# Patient Record
Sex: Male | Born: 1975 | Race: White | Hispanic: No | Marital: Married | Smoking: Never smoker
Health system: Southern US, Community
[De-identification: ages and names within clinical notes are randomized; demographics above are authoritative.]

## PROBLEM LIST (undated history)

## (undated) DIAGNOSIS — G251 Drug-induced tremor: Secondary | ICD-10-CM

## (undated) DIAGNOSIS — Z87442 Personal history of urinary calculi: Secondary | ICD-10-CM

## (undated) HISTORY — PX: PACEMAKER LEAD REMOVAL: SHX5064

## (undated) HISTORY — PX: OTHER SURGICAL HISTORY: SHX169

---

## 2008-05-31 ENCOUNTER — Emergency Department: Payer: Self-pay | Admitting: Emergency Medicine

## 2009-05-15 ENCOUNTER — Emergency Department: Payer: Self-pay | Admitting: Emergency Medicine

## 2009-07-20 ENCOUNTER — Ambulatory Visit: Payer: Self-pay | Admitting: Internal Medicine

## 2010-01-08 ENCOUNTER — Ambulatory Visit: Payer: Self-pay | Admitting: Cardiology

## 2010-01-10 ENCOUNTER — Ambulatory Visit: Payer: Self-pay | Admitting: Cardiology

## 2010-01-18 ENCOUNTER — Ambulatory Visit: Payer: Self-pay | Admitting: Cardiovascular Disease

## 2010-09-06 ENCOUNTER — Ambulatory Visit: Payer: Self-pay | Admitting: Internal Medicine

## 2010-09-07 ENCOUNTER — Ambulatory Visit: Payer: Self-pay | Admitting: Internal Medicine

## 2011-01-27 IMAGING — CT CT HEAD WITHOUT CONTRAST
1 series · 15 of 29 positions shown, 19 images · non-contrast
Comparison: none

REASON FOR EXAM: TIA  PACEMAKER  NOT ABLE TO HAVE MRI
COMMENTS:

PROCEDURE:     CT  - CT HEAD WITHOUT CONTRAST  - July 20, 2009  [DATE]
RESULT:
TECHNIQUE: Noncontrast CT of the brain is compared to previous images from
05/15/2009 and 05/31/2008.

[Series 2: soft tissue · axial · 0.41mm/px · z∈[+484,+614]mm · 15 of 29 slices shown, 19 images]
[im 2/29  brain]
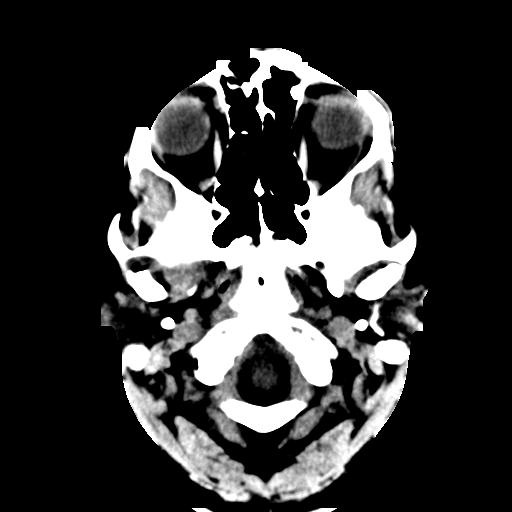
[im 2/29  bone]
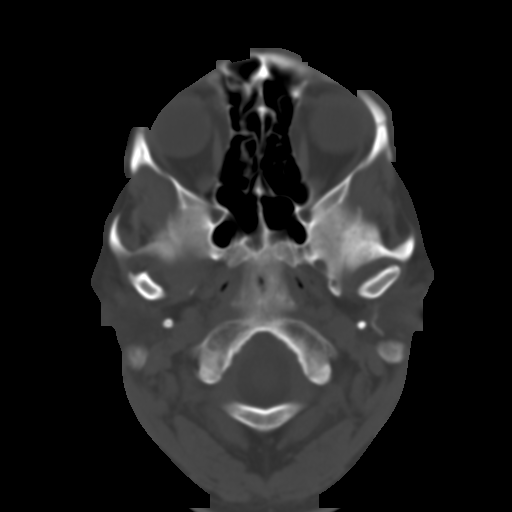
[im 4/29  brain]
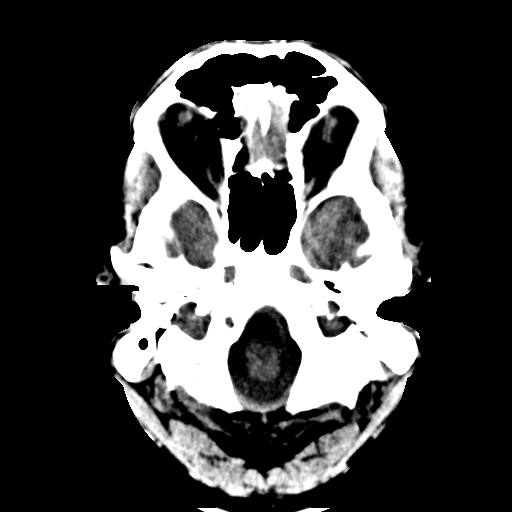
[im 6/29  brain]
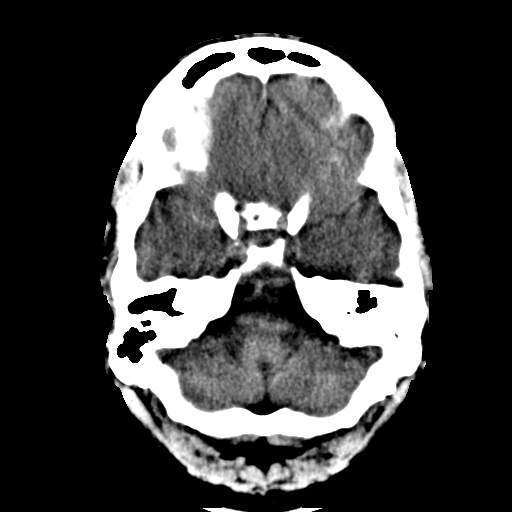
[im 8/29  brain]
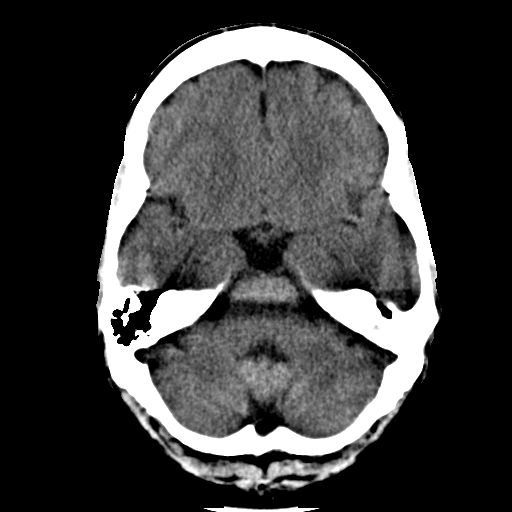
[im 10/29  brain]
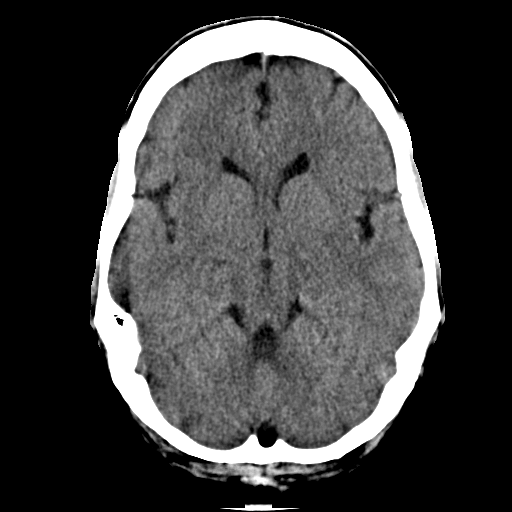
[im 10/29  bone]
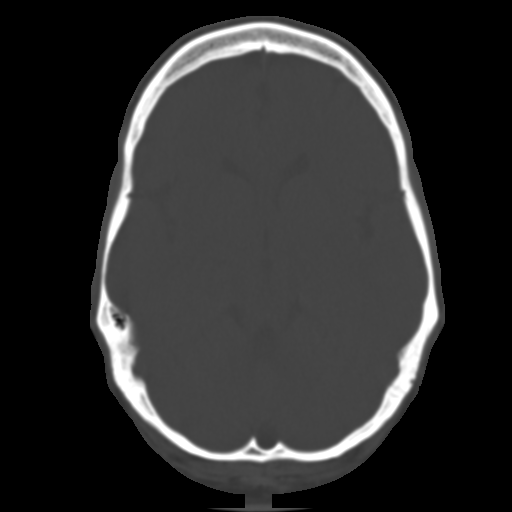
[im 11/29  brain]
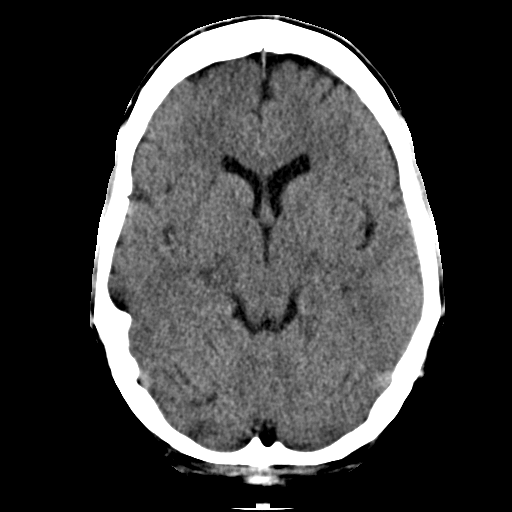
[im 13/29  brain]
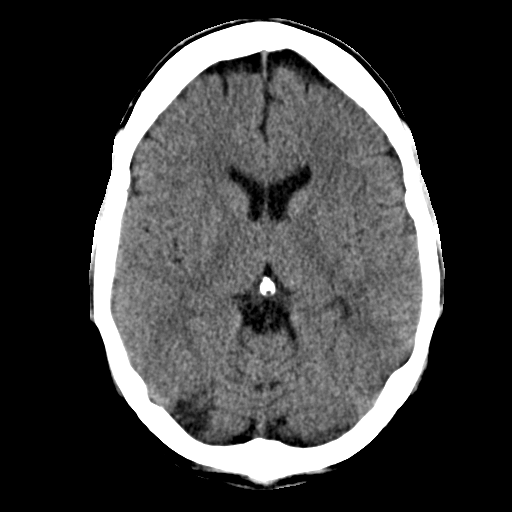
[im 15/29  brain]
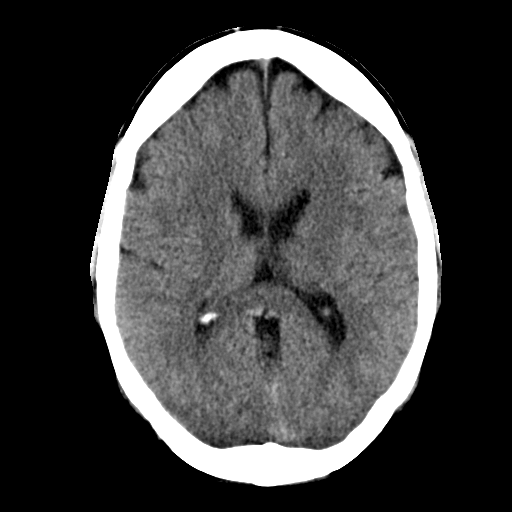
[im 17/29  brain]
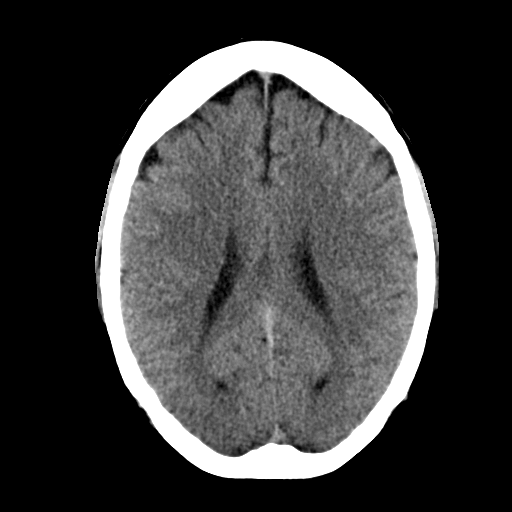
[im 17/29  bone]
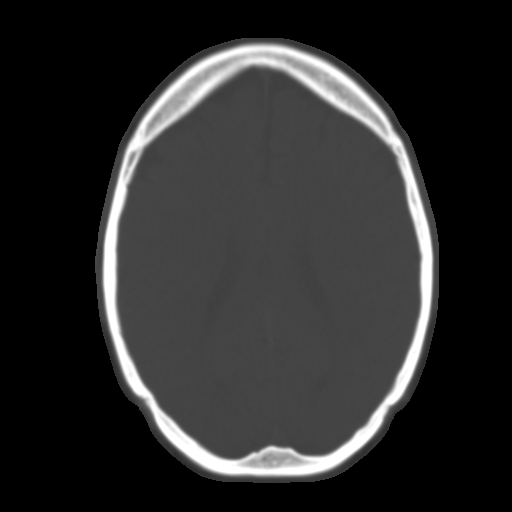
[im 19/29  brain]
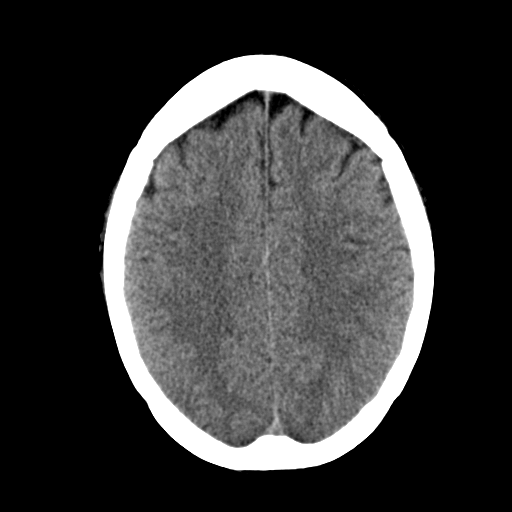
[im 20/29  brain]
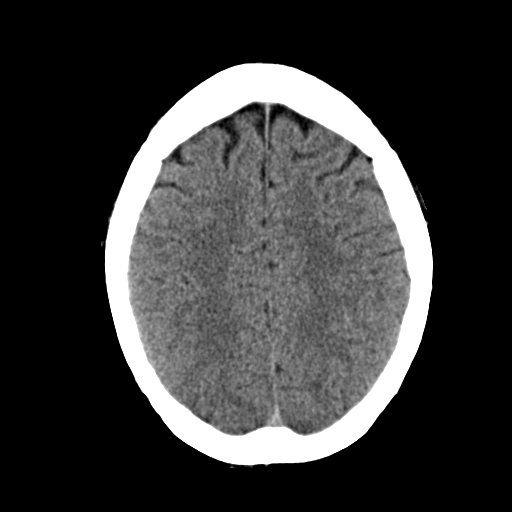
[im 22/29  brain]
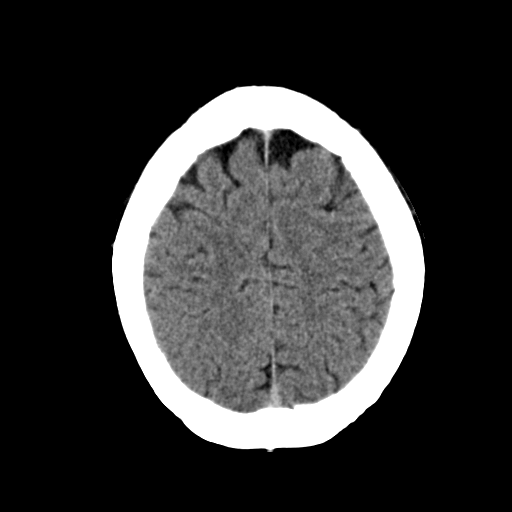
[im 24/29  brain]
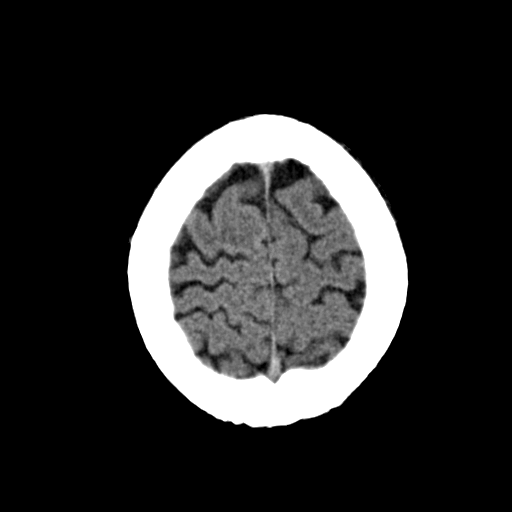
[im 24/29  bone]
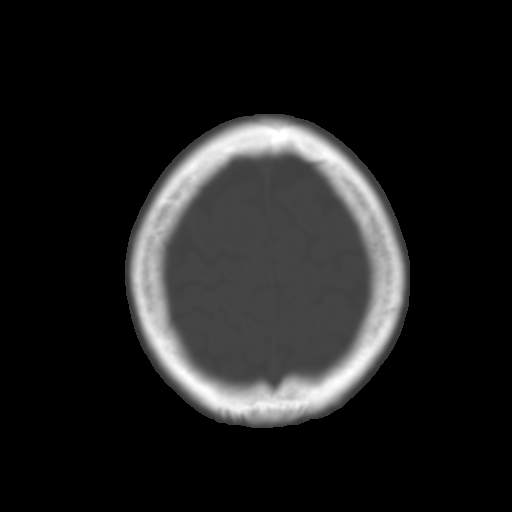
[im 26/29  brain]
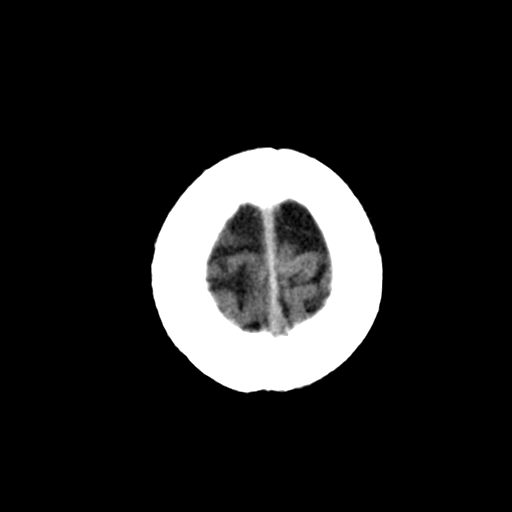
[im 28/29  brain]
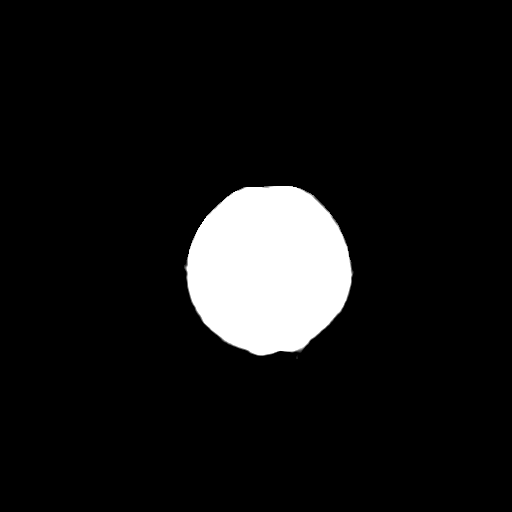

[15 of 29 positions shown; findings below may reference images not displayed]

FINDINGS: Today's exam again demonstrates an area of low attenuation in the
mid to superior portion of the right cerebellar hemisphere suggestive of
possible old infarct. There is mild prominence of the ventricles and sulci.
There is an old lacunar infarct on the left on image #16 which is unchanged.
There is no intracranial hemorrhage, mass effect or midline shift. No
significant interval change is appreciated. The included paranasal sinuses
and mastoid air cells demonstrate partial opacification in the left mastoid.
Correlate for an acute inflammatory process or infectious process. The right
mastoid and paranasal sinuses appear clear. The calvarium is intact. The
changes in the left mastoid appear chronic.
IMPRESSION: 1.  Chronic opacification of portions of the left mastoid.
2.  Old, small, right cerebellar infarct. This is stable.
3.  Old lacunar infarct in the subcortical region on the left.

## 2014-04-24 ENCOUNTER — Ambulatory Visit: Payer: Self-pay | Admitting: Physician Assistant

## 2014-09-13 ENCOUNTER — Encounter: Payer: Self-pay | Admitting: Podiatry

## 2014-09-13 ENCOUNTER — Ambulatory Visit (INDEPENDENT_AMBULATORY_CARE_PROVIDER_SITE_OTHER): Payer: BLUE CROSS/BLUE SHIELD | Admitting: Podiatry

## 2014-09-13 ENCOUNTER — Ambulatory Visit (INDEPENDENT_AMBULATORY_CARE_PROVIDER_SITE_OTHER): Payer: BLUE CROSS/BLUE SHIELD

## 2014-09-13 VITALS — Ht 72.0 in | Wt 240.0 lb

## 2014-09-13 DIAGNOSIS — M79672 Pain in left foot: Secondary | ICD-10-CM

## 2014-09-13 DIAGNOSIS — M205X2 Other deformities of toe(s) (acquired), left foot: Secondary | ICD-10-CM

## 2014-09-13 DIAGNOSIS — M21622 Bunionette of left foot: Secondary | ICD-10-CM

## 2014-09-13 NOTE — Progress Notes (Signed)
   Subjective:    Patient ID: Nicholas Weaver, male    DOB: 05-30-1975, 39 y.o.   MRN: [id number]  HPI 39 year old male presents the office with complaints of left foot pain which is been ongoing since the wintertime. He states that he has pain to the outside aspect of his left foot under the ball of the foot only with weightbearing. He has no pain at rest. He states he fell over the plantar and had pain to the area after the fall which subsided but now he is started to have pain to the area. He also has some discomfort on the top outside aspect of the left foot as well again only with weightbearing. He denies any recent swelling or redness. Denies any numbness or tingling. The pain does not wake him up at night. No other complaints at this time.   Review of Systems  All other systems reviewed and are negative.      Objective:   Physical Exam AAO x3, NAD DP/PT pulses palpable bilaterally, CRT less than 3 seconds Protective sensation intact with Simms Weinstein monofilament, vibratory sensation intact, Achilles tendon reflex intact There is mild prominence of the lateral aspect of the fifth metatarsal head laterally. There is a small bursa formation on the lateral aspect metatarsal head without any associated erythema. There is mild to palpation overlying this area as well as left submetatarsal 5. There is no pain with vibratory sensation. There is also mild discomfort on the dorsal lateral S with the left foot along the fourth and fifth metatarsal cuboid joint. There is no pain vibratory sensation overlying that area. Is no overlying edema, erythema, increase in warmth. No other areas of tenderness to bilateral lower extremities. MMT 5/5, ROM WNL.  No open lesions or pre-ulcerative lesions.  No overlying edema, erythema, increase in warmth to bilateral lower extremities.  No pain with calf compression, swelling, warmth, erythema bilaterally.     Assessment & Plan:   39 year old male with  left tailors bunion; midfoot arthritis  -X-rays were obtained and reviewed the patient  -Treatment options were discussed included alternatives, risks, complications.  -Discussed likely etiology of his symptoms.  -Discussed possible sterile injection however patient wishes to hold off at this time.  -Will off on anti-inflammatory is given heart conditions.  -Discussed shoe gear modifications and orthotics to help offload this since Medicare areas.  -Dispensed offloading pads. Upon weightbearing while wearing the pad he states that there was reduction in symptoms.  -Follow-up of symptoms are not resolved within 4 weeks or sooner if any problems are to arise. Call any questions or concerns in the meantime.

## 2016-02-15 DIAGNOSIS — I429 Cardiomyopathy, unspecified: Secondary | ICD-10-CM

## 2016-02-15 HISTORY — DX: Cardiomyopathy, unspecified: I42.9

## 2016-11-16 DIAGNOSIS — G4701 Insomnia due to medical condition: Secondary | ICD-10-CM

## 2016-11-16 DIAGNOSIS — F321 Major depressive disorder, single episode, moderate: Secondary | ICD-10-CM | POA: Insufficient documentation

## 2016-11-16 HISTORY — DX: Insomnia due to medical condition: G47.01

## 2016-11-16 HISTORY — DX: Major depressive disorder, single episode, moderate: F32.1

## 2017-01-11 DIAGNOSIS — Z941 Heart transplant status: Secondary | ICD-10-CM | POA: Insufficient documentation

## 2017-01-11 HISTORY — DX: Heart transplant status: Z94.1

## 2017-01-22 DIAGNOSIS — D508 Other iron deficiency anemias: Secondary | ICD-10-CM | POA: Insufficient documentation

## 2017-01-22 HISTORY — DX: Other iron deficiency anemias: D50.8

## 2017-01-30 DIAGNOSIS — G251 Drug-induced tremor: Secondary | ICD-10-CM | POA: Insufficient documentation

## 2017-02-24 MED ORDER — DEXTROSE 50 % IV SOLN
12.50 | INTRAVENOUS | Status: DC
Start: ? — End: 2017-02-24

## 2017-02-24 MED ORDER — GENERIC EXTERNAL MEDICATION
15.00 | Status: DC
Start: 2017-02-25 — End: 2017-02-24

## 2017-02-24 MED ORDER — CALCIUM CARBONATE-VITAMIN D 600-400 MG-UNIT PO TABS
1.00 | ORAL_TABLET | ORAL | Status: DC
Start: 2017-02-24 — End: 2017-02-24

## 2017-02-24 MED ORDER — ACETAMINOPHEN 325 MG PO TABS
650.00 | ORAL_TABLET | ORAL | Status: DC
Start: ? — End: 2017-02-24

## 2017-02-24 MED ORDER — NYSTATIN 100000 UNIT/ML MT SUSP
5.00 | OROMUCOSAL | Status: DC
Start: 2017-02-24 — End: 2017-02-24

## 2017-02-24 MED ORDER — MYCOPHENOLATE MOFETIL 500 MG PO TABS
500.00 | ORAL_TABLET | ORAL | Status: DC
Start: 2017-02-24 — End: 2017-02-24

## 2017-02-24 MED ORDER — PANTOPRAZOLE SODIUM 40 MG PO TBEC
40.00 | DELAYED_RELEASE_TABLET | ORAL | Status: DC
Start: 2017-02-25 — End: 2017-02-24

## 2017-02-24 MED ORDER — SODIUM CHLORIDE 0.65 % NA SOLN
2.00 | NASAL | Status: DC
Start: ? — End: 2017-02-24

## 2017-02-24 MED ORDER — TACROLIMUS 1 MG PO CAPS
1.00 | ORAL_CAPSULE | ORAL | Status: DC
Start: 2017-02-24 — End: 2017-02-24

## 2017-02-24 MED ORDER — FUROSEMIDE 80 MG PO TABS
80.00 | ORAL_TABLET | ORAL | Status: DC
Start: 2017-02-25 — End: 2017-02-24

## 2017-02-24 MED ORDER — TACROLIMUS 1 MG PO CAPS
1.00 | ORAL_CAPSULE | ORAL | Status: DC
Start: 2017-02-25 — End: 2017-02-24

## 2017-02-24 MED ORDER — VALACYCLOVIR HCL 500 MG PO TABS
500.00 | ORAL_TABLET | ORAL | Status: DC
Start: 2017-02-24 — End: 2017-02-24

## 2017-02-24 MED ORDER — GLUCAGON HCL RDNA (DIAGNOSTIC) 1 MG IJ SOLR
1.00 | INTRAMUSCULAR | Status: DC
Start: ? — End: 2017-02-24

## 2017-02-24 MED ORDER — GENERIC EXTERNAL MEDICATION
750.00 | Status: DC
Start: 2017-02-24 — End: 2017-02-24

## 2017-02-24 MED ORDER — POTASSIUM CHLORIDE ER 20 MEQ PO TBCR
20.00 | EXTENDED_RELEASE_TABLET | ORAL | Status: DC
Start: 2017-02-24 — End: 2017-02-24

## 2017-02-24 MED ORDER — GENERIC EXTERNAL MEDICATION
2.00 | Status: DC
Start: 2017-02-24 — End: 2017-02-24

## 2017-02-24 MED ORDER — POLYETHYLENE GLYCOL 3350 17 G PO PACK
17.00 | PACK | ORAL | Status: DC
Start: ? — End: 2017-02-24

## 2017-02-24 MED ORDER — MIRTAZAPINE 15 MG PO TABS
15.00 | ORAL_TABLET | ORAL | Status: DC
Start: 2017-02-24 — End: 2017-02-24

## 2017-02-24 MED ORDER — HEPARIN SODIUM (PORCINE) PF 5000 UNIT/0.5ML IJ SOLN
5000.00 | INTRAMUSCULAR | Status: DC
Start: 2017-02-24 — End: 2017-02-24

## 2017-04-03 DIAGNOSIS — D849 Immunodeficiency, unspecified: Secondary | ICD-10-CM | POA: Insufficient documentation

## 2017-04-03 HISTORY — DX: Immunodeficiency, unspecified: D84.9

## 2017-07-09 DIAGNOSIS — K8020 Calculus of gallbladder without cholecystitis without obstruction: Secondary | ICD-10-CM

## 2017-07-09 HISTORY — DX: Calculus of gallbladder without cholecystitis without obstruction: K80.20

## 2017-09-25 ENCOUNTER — Encounter: Payer: BLUE CROSS/BLUE SHIELD | Attending: Internal Medicine

## 2017-11-04 DIAGNOSIS — K862 Cyst of pancreas: Secondary | ICD-10-CM

## 2017-11-04 HISTORY — DX: Cyst of pancreas: K86.2

## 2017-11-17 ENCOUNTER — Other Ambulatory Visit: Payer: Self-pay | Admitting: Internal Medicine

## 2017-11-17 ENCOUNTER — Ambulatory Visit
Admission: RE | Admit: 2017-11-17 | Discharge: 2017-11-17 | Disposition: A | Discharge status: Home / Self Care | Payer: BLUE CROSS/BLUE SHIELD | Source: Ambulatory Visit | Attending: Internal Medicine | Admitting: Internal Medicine

## 2017-11-17 DIAGNOSIS — M7989 Other specified soft tissue disorders: Secondary | ICD-10-CM | POA: Insufficient documentation

## 2017-11-18 ENCOUNTER — Other Ambulatory Visit: Payer: Self-pay | Admitting: Internal Medicine

## 2017-11-18 ENCOUNTER — Ambulatory Visit
Admission: RE | Admit: 2017-11-18 | Discharge: 2017-11-18 | Disposition: A | Discharge status: Home / Self Care | Payer: BLUE CROSS/BLUE SHIELD | Source: Ambulatory Visit | Attending: Internal Medicine | Admitting: Internal Medicine

## 2017-11-18 DIAGNOSIS — K853 Drug induced acute pancreatitis without necrosis or infection: Secondary | ICD-10-CM | POA: Insufficient documentation

## 2017-11-18 DIAGNOSIS — Z9049 Acquired absence of other specified parts of digestive tract: Secondary | ICD-10-CM | POA: Insufficient documentation

## 2017-11-18 DIAGNOSIS — K863 Pseudocyst of pancreas: Secondary | ICD-10-CM | POA: Diagnosis not present

## 2017-11-20 ENCOUNTER — Ambulatory Visit: Payer: BLUE CROSS/BLUE SHIELD

## 2018-05-13 DIAGNOSIS — E782 Mixed hyperlipidemia: Secondary | ICD-10-CM

## 2018-05-13 HISTORY — DX: Mixed hyperlipidemia: E78.2

## 2018-05-15 DIAGNOSIS — K7400 Hepatic fibrosis, unspecified: Secondary | ICD-10-CM

## 2018-05-15 HISTORY — DX: Hepatic fibrosis, unspecified: K74.00

## 2018-12-07 ENCOUNTER — Encounter: Payer: BC Managed Care – PPO | Attending: Internal Medicine | Admitting: *Deleted

## 2018-12-07 ENCOUNTER — Other Ambulatory Visit: Payer: Self-pay

## 2018-12-07 DIAGNOSIS — Z7982 Long term (current) use of aspirin: Secondary | ICD-10-CM | POA: Insufficient documentation

## 2018-12-07 DIAGNOSIS — Z941 Heart transplant status: Secondary | ICD-10-CM | POA: Insufficient documentation

## 2018-12-07 DIAGNOSIS — Z7901 Long term (current) use of anticoagulants: Secondary | ICD-10-CM | POA: Insufficient documentation

## 2018-12-07 DIAGNOSIS — Z7952 Long term (current) use of systemic steroids: Secondary | ICD-10-CM | POA: Insufficient documentation

## 2018-12-07 DIAGNOSIS — Z79899 Other long term (current) drug therapy: Secondary | ICD-10-CM | POA: Insufficient documentation

## 2018-12-07 NOTE — Progress Notes (Signed)
Virtual Orientation completed 

## 2018-12-14 ENCOUNTER — Other Ambulatory Visit: Payer: Self-pay

## 2018-12-14 VITALS — Ht 71.25 in | Wt 229.4 lb

## 2018-12-14 DIAGNOSIS — Z7901 Long term (current) use of anticoagulants: Secondary | ICD-10-CM | POA: Diagnosis not present

## 2018-12-14 DIAGNOSIS — Z7982 Long term (current) use of aspirin: Secondary | ICD-10-CM | POA: Diagnosis not present

## 2018-12-14 DIAGNOSIS — Z7952 Long term (current) use of systemic steroids: Secondary | ICD-10-CM | POA: Diagnosis not present

## 2018-12-14 DIAGNOSIS — Z79899 Other long term (current) drug therapy: Secondary | ICD-10-CM | POA: Diagnosis not present

## 2018-12-14 DIAGNOSIS — Z941 Heart transplant status: Secondary | ICD-10-CM | POA: Diagnosis present

## 2018-12-14 NOTE — Progress Notes (Signed)
Cardiac Individual Treatment Plan  Patient Details  Name: Nicholas Weaver MRN: [id number] Date of Birth: [birth year] Referring Provider:     Cardiac Rehab from 12/14/2018 in [facility] Cardiac and Pulmonary Rehab  Referring Provider  McGarrah      Initial Encounter Date:    Cardiac Rehab from 12/14/2018 in [facility] Cardiac and Pulmonary Rehab  Date  12/14/18      Visit Diagnosis: Status post heart transplant [facility])  Patient's Home Medications on Admission:  Current Outpatient Medications:  .  allopurinol (ZYLOPRIM) 100 MG tablet, Take by mouth., Disp: , Rfl:  .  amLODipine (NORVASC) 10 MG tablet, Take by mouth., Disp: , Rfl:  .  aspirin EC 81 MG tablet, Take by mouth., Disp: , Rfl:  .  calcium carbonate (CALTRATE 600) 1500 (600 Ca) MG TABS tablet, Take by mouth., Disp: , Rfl:  .  Evolocumab 140 MG/ML SOAJ, Inject into the skin., Disp: , Rfl:  .  metoprolol succinate (TOPROL-XL) 25 MG 24 hr tablet, Take by mouth., Disp: , Rfl:  .  mycophenolate (CELLCEPT) 500 MG tablet, Take by mouth., Disp: , Rfl:  .  pantoprazole (PROTONIX) 40 MG tablet, TAKE 1 TABLET BY MOUTH EVERY DAY, Disp: , Rfl:  .  predniSONE (DELTASONE) 2.5 MG tablet, TAKE 1 TABLET BY MOUTH ONCE DAILY TAKE 10 MG DAILY, THEN PER PREDNISONE TAPER AS DIRECTED., Disp: , Rfl:  .  tacrolimus (PROGRAF) 1 MG capsule, Take by mouth., Disp: , Rfl:  .  torsemide (DEMADEX) 20 MG tablet, Take by mouth., Disp: , Rfl:  .  warfarin (COUMADIN) 3 MG tablet, TAKE 1 TABLET BY MOUTH ONCE A DAY, Disp: , Rfl:   Past Medical History: No past medical history on file.  Tobacco Use: Social History   Tobacco Use  Smoking Status Never Smoker  Smokeless Tobacco Never Used    Labs: Recent Review Flowsheet Data    There is no flowsheet data to display.       Exercise Target Goals: Exercise Program Goal: Individual exercise prescription set using results from initial 6 min walk test and THRR while considering  patient's activity barriers and safety.    Exercise Prescription Goal: Initial exercise prescription builds to 30-45 minutes a day of aerobic activity, 2-3 days per week.  Home exercise guidelines will be given to patient during program as part of exercise prescription that the participant will acknowledge.  Activity Barriers & Risk Stratification: Activity Barriers & Cardiac Risk Stratification - 12/07/18 1243      Activity Barriers & Cardiac Risk Stratification   Activity Barriers  Deconditioning    Cardiac Risk Stratification  High       6 Minute Walk: 6 Minute Walk    Row Name 12/14/18 1615         6 Minute Walk   Distance  1645 feet     Walk Time  6 minutes     # of Rest Breaks  0     MPH  3.11     METS  5.17     RPE  11     Perceived Dyspnea   2     VO2 Peak  18.09     Symptoms  No     Resting HR  97 bpm     Resting BP  124/82     Resting Oxygen Saturation   96 %     Exercise Oxygen Saturation  during 6 min walk  96 %     Max Ex. HR  129 bpm     Max Ex. BP  150/84     2 Minute Post BP  124/78        Oxygen Initial Assessment:   Oxygen Re-Evaluation:   Oxygen Discharge (Final Oxygen Re-Evaluation):   Initial Exercise Prescription: Initial Exercise Prescription - 12/14/18 1600      Date of Initial Exercise RX and Referring Provider   Date  12/14/18    Referring Provider  McGarrah      Treadmill   MPH  3.1    Grade  1.5    Minutes  15    METs  4      NuStep   Level  4    SPM  80    Minutes  15    METs  4      REL-XR   Level  3    Speed  50    Minutes  15    METs  4      Prescription Details   Frequency (times per week)  3    Duration  Progress to 30 minutes of continuous aerobic without signs/symptoms of physical distress      Intensity   THRR 40-80% of Max Heartrate  128-160    Ratings of Perceived Exertion  11-15    Perceived Dyspnea  0-4      Resistance Training   Training Prescription  Yes    Weight  3 lb    Reps  10-15       Perform Capillary Blood Glucose  checks as needed.  Exercise Prescription Changes: Exercise Prescription Changes    Row Name 12/14/18 1600             Response to Exercise   Blood Pressure (Admit)  124/82       Blood Pressure (Exercise)  150/84       Blood Pressure (Exit)  124/78       Heart Rate (Admit)  97 bpm       Heart Rate (Exercise)  129 bpm       Heart Rate (Exit)  105 bpm       Oxygen Saturation (Admit)  96 %       Oxygen Saturation (Exercise)  96 %       Rating of Perceived Exertion (Exercise)  11       Perceived Dyspnea (Exercise)  2       Symptoms  none          Exercise Comments:   Exercise Goals and Review: Exercise Goals    Row Name 12/14/18 1620             Exercise Goals   Increase Physical Activity  Yes       Intervention  Provide advice, education, support and counseling about physical activity/exercise needs.;Develop an individualized exercise prescription for aerobic and resistive training based on initial evaluation findings, risk stratification, comorbidities and participant's personal goals.       Expected Outcomes  Short Term: Attend rehab on a regular basis to increase amount of physical activity.;Long Term: Add in home exercise to make exercise part of routine and to increase amount of physical activity.;Long Term: Exercising regularly at least 3-5 days a week.       Increase Strength and Stamina  Yes       Intervention  Provide advice, education, support and counseling about physical activity/exercise needs.;Develop an individualized exercise prescription for aerobic and resistive training based on initial evaluation findings, risk  stratification, comorbidities and participant's personal goals.       Expected Outcomes  Short Term: Increase workloads from initial exercise prescription for resistance, speed, and METs.;Short Term: Perform resistance training exercises routinely during rehab and add in resistance training at home;Long Term: Improve cardiorespiratory fitness, muscular  endurance and strength as measured by increased METs and functional capacity (6MWT)       Able to understand and use rate of perceived exertion (RPE) scale  Yes       Intervention  Provide education and explanation on how to use RPE scale       Expected Outcomes  Short Term: Able to use RPE daily in rehab to express subjective intensity level;Long Term:  Able to use RPE to guide intensity level when exercising independently       Knowledge and understanding of Target Heart Rate Range (THRR)  Yes       Intervention  Provide education and explanation of THRR including how the numbers were predicted and where they are located for reference       Expected Outcomes  Short Term: Able to state/look up THRR;Short Term: Able to use daily as guideline for intensity in rehab;Long Term: Able to use THRR to govern intensity when exercising independently       Able to check pulse independently  Yes       Intervention  Provide education and demonstration on how to check pulse in carotid and radial arteries.;Review the importance of being able to check your own pulse for safety during independent exercise       Expected Outcomes  Short Term: Able to explain why pulse checking is important during independent exercise;Long Term: Able to check pulse independently and accurately       Understanding of Exercise Prescription  Yes       Intervention  Provide education, explanation, and written materials on patient's individual exercise prescription       Expected Outcomes  Short Term: Able to explain program exercise prescription;Long Term: Able to explain home exercise prescription to exercise independently          Exercise Goals Re-Evaluation :   Discharge Exercise Prescription (Final Exercise Prescription Changes): Exercise Prescription Changes - 12/14/18 1600      Response to Exercise   Blood Pressure (Admit)  124/82    Blood Pressure (Exercise)  150/84    Blood Pressure (Exit)  124/78    Heart Rate (Admit)   97 bpm    Heart Rate (Exercise)  129 bpm    Heart Rate (Exit)  105 bpm    Oxygen Saturation (Admit)  96 %    Oxygen Saturation (Exercise)  96 %    Rating of Perceived Exertion (Exercise)  11    Perceived Dyspnea (Exercise)  2    Symptoms  none       Nutrition:  Target Goals: Understanding of nutrition guidelines, daily intake of sodium <1573m, cholesterol <2054m calories 30% from fat and 7% or less from saturated fats, daily to have 5 or more servings of fruits and vegetables.  Biometrics: Pre Biometrics - 12/14/18 1620      Pre Biometrics   Height  5' 11.25" (1.81 m)    Weight  229 lb 6.4 oz (104.1 kg)    BMI (Calculated)  31.76        Nutrition Therapy Plan and Nutrition Goals:   Nutrition Assessments: Nutrition Assessments - 12/07/18 1718      MEDFICTS Scores   Pre Score  125       Nutrition Goals Re-Evaluation:   Nutrition Goals Discharge (Final Nutrition Goals Re-Evaluation):   Psychosocial: Target Goals: Acknowledge presence or absence of significant depression and/or stress, maximize coping skills, provide positive support system. Participant is able to verbalize types and ability to use techniques and skills needed for reducing stress and depression.   Initial Review & Psychosocial Screening: Initial Psych Review & Screening - 12/07/18 1244      Initial Review   Current issues with  Current Sleep Concerns   Not sleeping as well in past 2 months.  Waking up frequently.     Family Dynamics   Good Support System?  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      Screening Interventions   Interventions  Encouraged to exercise;To provide support and resources with identified psychosocial needs;Provide feedback about the scores to  participant    Expected Outcomes  Short Term goal: Utilizing psychosocial counselor, staff and physician to assist with identification of specific Stressors or current issues interfering with healing process. Setting desired goal for each stressor or current issue identified.;Long Term Goal: Stressors or current issues are controlled or eliminated.;Short Term goal: Identification and review with participant of any Quality of Life or Depression concerns found by scoring the questionnaire.;Long Term goal: The participant improves quality of Life and PHQ9 Scores as seen by post scores and/or verbalization of changes       Quality of Life Scores:  Quality of Life - 12/07/18 1716      Quality of Life   Select  Quality of Life      Quality of Life Scores   Health/Function Pre  15.8 %    Socioeconomic Pre  18.56 %    Psych/Spiritual Pre  17.07 %    Family Pre  27.6 %    GLOBAL Pre  18.37 %      Scores of 19 and below usually indicate a poorer quality of life in these areas.  A difference of  2-3 points is a clinically meaningful difference.  A difference of 2-3 points in the total score of the Quality of Life Index has been associated with significant improvement in overall quality of life, self-image, physical symptoms, and general health in studies assessing change in quality of life.  PHQ-9: Recent Review Flowsheet Data    Depression screen [facility] 2/9 12/14/2018   Decreased Interest 2   Down, Depressed, Hopeless 1   PHQ - 2 Score 3   Altered sleeping 2   Tired, decreased energy 2   Change in appetite 1   Feeling bad or failure about yourself  3   Trouble concentrating 1   Moving slowly or fidgety/restless 0   Suicidal thoughts 0   PHQ-9 Score 12   Difficult doing work/chores Somewhat difficult     Interpretation of Total Score  Total Score Depression Severity:  1-4 = Minimal depression, 5-9 = Mild depression, 10-14 = Moderate depression, 15-19 = Moderately severe depression, 20-27 =  Severe depression   Psychosocial Evaluation and Intervention: Psychosocial Evaluation - 12/07/18 1712      Psychosocial Evaluation & Interventions   Interventions  Relaxation education;Stress management education;Encouraged to exercise with the program and follow exercise prescription    Comments  Nicholas Weaver has no barriers to the program. He does state that he has some sleep concerns. Waking up to go to the bathroom and not falling asleep immediately, occuring in the past few months. 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 Will review QOL scores with Nicholas Weaver during his next visit. Kohle feels deconditioned and hopes to see improvement in his exercise capacity..    Expected Outcomes  STG: Arvell will see improvement in energy levels and hopefully sleep pattern.  :LTG: continued improvements and higher QOL scores at discharge    Continue Psychosocial Services   Follow up required by staff       Psychosocial Re-Evaluation:   Psychosocial Discharge (Final Psychosocial Re-Evaluation):   Vocational Rehabilitation: Provide vocational rehab assistance to qualifying candidates.   Vocational Rehab Evaluation & Intervention: Vocational Rehab - 12/07/18 1248      Initial Vocational Rehab Evaluation & Intervention   Assessment shows need for Vocational Rehabilitation  No       Education: Education Goals: Education classes will be provided on a variety of topics geared toward better understanding of heart health and risk factor modification. Participant will state understanding/return demonstration of topics presented as noted by education test scores.  Learning Barriers/Preferences: Learning Barriers/Preferences - 12/07/18 1248      Learning Barriers/Preferences   Learning Barriers  None    Learning Preferences  None       Education Topics:  AED/CPR: - Group verbal and written instruction with the use  of models to demonstrate the basic use of the AED with the basic ABC's of resuscitation.   General Nutrition Guidelines/Fats and Fiber: -Group instruction provided by verbal, written material, models and posters to present the general guidelines for heart healthy nutrition. Gives an explanation and review of dietary fats and fiber.   Controlling Sodium/Reading Food Labels: -Group verbal and written material supporting the discussion of sodium use in heart healthy nutrition. Review and explanation with models, verbal and written materials for utilization of the food label.   Exercise Physiology & General Exercise Guidelines: - Group verbal and written instruction with models to review the exercise physiology of the cardiovascular system and associated critical values. Provides general exercise guidelines with specific guidelines to those with heart or lung disease.    Aerobic Exercise & Resistance Training: - Gives group verbal and written instruction on the various components of exercise. Focuses on aerobic and resistive training programs and the benefits of this training and how to safely progress through these programs..   Flexibility, Balance, Mind/Body Relaxation: Provides group verbal/written instruction on the benefits of flexibility and balance training, including mind/body exercise modes such as yoga, pilates and tai chi.  Demonstration and skill practice provided.   Stress and Anxiety: - Provides group verbal and written instruction about the health risks of elevated stress and causes of high stress.  Discuss the correlation between heart/lung disease and anxiety and treatment options. Review healthy ways to manage with stress and anxiety.   Depression: - Provides group verbal and written instruction on the correlation between heart/lung disease and depressed mood, treatment options, and the stigmas associated with seeking treatment.   Anatomy & Physiology of the Heart: -  Group verbal and written instruction and models provide basic cardiac anatomy and physiology, with the coronary electrical and arterial systems. Review of Valvular disease and Heart Failure   Cardiac Procedures: - Group verbal and written instruction  to review commonly prescribed medications for heart disease. Reviews the medication, class of the drug, and side effects. Includes the steps to properly store meds and maintain the prescription regimen. (beta blockers and nitrates)   Cardiac Medications I: - Group verbal and written instruction to review commonly prescribed medications for heart disease. Reviews the medication, class of the drug, and side effects. Includes the steps to properly store meds and maintain the prescription regimen.   Cardiac Medications II: -Group verbal and written instruction to review commonly prescribed medications for heart disease. Reviews the medication, class of the drug, and side effects. (all other drug classes)    Go Sex-Intimacy & Heart Disease, Get SMART - Goal Setting: - Group verbal and written instruction through game format to discuss heart disease and the return to sexual intimacy. Provides group verbal and written material to discuss and apply goal setting through the application of the S.M.A.R.T. Method.   Other Matters of the Heart: - Provides group verbal, written materials and models to describe Stable Angina and Peripheral Artery. Includes description of the disease process and treatment options available to the cardiac patient.   Exercise & Equipment Safety: - Individual verbal instruction and demonstration of equipment use and safety with use of the equipment.   Cardiac Rehab from 12/14/2018 in [facility] Cardiac and Pulmonary Rehab  Date  12/14/18  Educator  AS  Instruction Review Code  1- Verbalizes Understanding      Infection Prevention: - Provides verbal and written material to individual with discussion of infection control including  proper hand washing and proper equipment cleaning during exercise session.   Cardiac Rehab from 12/14/2018 in [facility] Cardiac and Pulmonary Rehab  Date  12/14/18  Educator  AS  Instruction Review Code  1- Verbalizes Understanding      Falls Prevention: - Provides verbal and written material to individual with discussion of falls prevention and safety.   Diabetes: - Individual verbal and written instruction to review signs/symptoms of diabetes, desired ranges of glucose level fasting, after meals and with exercise. Acknowledge that pre and post exercise glucose checks will be done for 3 sessions at entry of program.   Know Your Numbers and Risk Factors: -Group verbal and written instruction about important numbers in your health.  Discussion of what are risk factors and how they play a role in the disease process.  Review of Cholesterol, Blood Pressure, Diabetes, and BMI and the role they play in your overall health.   Sleep Hygiene: -Provides group verbal and written instruction about how sleep can affect your health.  Define sleep hygiene, discuss sleep cycles and impact of sleep habits. Review good sleep hygiene tips.    Other: -Provides group and verbal instruction on various topics (see comments)   Knowledge Questionnaire Score: Knowledge Questionnaire Score - 12/07/18 1718      Knowledge Questionnaire Score   Pre Score  23/26  MIssed angina, nutrition and exercise questions       Core Components/Risk Factors/Patient Goals at Admission: Personal Goals and Risk Factors at Admission - 12/14/18 1622      Core Components/Risk Factors/Patient Goals on Admission    Weight Management  Weight Loss;Obesity    Intervention  Weight Management/Obesity: Establish reasonable short term and long term weight goals.    Admit Weight  229 lb 6.4 oz (104.1 kg)    Goal Weight: Long Term  200 lb (90.7 kg)    Expected Outcomes  Short Term: Continue to assess and modify interventions until short  term weight is achieved;Long Term: Adherence to nutrition and physical activity/exercise program aimed toward attainment of established weight goal;Weight Loss: Understanding of general recommendations for a balanced deficit meal plan, which promotes 1-2 lb weight loss per week and includes a negative energy balance of [phone] kcal/d;Weight Maintenance: Understanding of the daily nutrition guidelines, which includes 25-35% calories from fat, 7% or less cal from saturated fats, less than 239m cholesterol, less than 1.5gm of sodium, & 5 or more servings of fruits and vegetables daily;Understanding recommendations for meals to include 15-35% energy as protein, 25-35% energy from fat, 35-60% energy from carbohydrates, less than 2033mof dietary cholesterol, 20-35 gm of total fiber daily;Understanding of distribution of calorie intake throughout the day with the consumption of 4-5 meals/snacks;Weight Gain: Understanding of general recommendations for a high calorie, high protein meal plan that promotes weight gain by distributing calorie intake throughout the day with the consumption for 4-5 meals, snacks, and/or supplements    Intervention  Provide education and support for participant on nutrition & aerobic/resistive exercise along with prescribed medications to achieve LDL <7075mHDL >77m56m  Expected Outcomes  Short Term: Participant states understanding of desired cholesterol values and is compliant with medications prescribed. Participant is following exercise prescription and nutrition guidelines.;Long Term: Cholesterol controlled with medications as prescribed, with individualized exercise RX and with personalized nutrition plan. Value goals: LDL < 70mg67mL > 40 mg.       Core Components/Risk Factors/Patient Goals Review:    Core Components/Risk Factors/Patient Goals at Discharge (Final Review):    ITP Comments: ITP Comments    Row Name 12/07/18 1432 12/14/18 1614         ITP Comments   Virtual Orientation completed   Appt made for 8/24 to complete EP/RD Evals and gym orientation  Completed initital 6MWT and nutrition eval.  ITP created  and sent for review         Comments: initial ITP

## 2018-12-14 NOTE — Patient Instructions (Signed)
Patient Instructions  Patient Details  Name: Nicholas Weaver MRN: [id number] Date of Birth: 12-14-1975 Referring Provider:  Alysia Penna*  Below are your personal goals for exercise, nutrition, and risk factors. Our goal is to help you stay on track towards obtaining and maintaining these goals. We will be discussing your progress on these goals with you throughout the program.  Initial Exercise Prescription: Initial Exercise Prescription - 12/14/18 1600      Date of Initial Exercise RX and Referring Provider   Date  12/14/18    Referring Provider  McGarrah      Treadmill   MPH  3.1    Grade  1.5    Minutes  15    METs  4      NuStep   Level  4    SPM  80    Minutes  15    METs  4      REL-XR   Level  3    Speed  50    Minutes  15    METs  4      Prescription Details   Frequency (times per week)  3    Duration  Progress to 30 minutes of continuous aerobic without signs/symptoms of physical distress      Intensity   THRR 40-80% of Max Heartrate  128-160    Ratings of Perceived Exertion  11-15    Perceived Dyspnea  0-4      Resistance Training   Training Prescription  Yes    Weight  3 lb    Reps  10-15       Exercise Goals: Frequency: Be able to perform aerobic exercise two to three times per week in program working toward 2-5 days per week of home exercise.  Intensity: Work with a perceived exertion of 11 (fairly light) - 15 (hard) while following your exercise prescription.  We will make changes to your prescription with you as you progress through the program.   Duration: Be able to do 30 to 45 minutes of continuous aerobic exercise in addition to a 5 minute warm-up and a 5 minute cool-down routine.   Nutrition Goals: Your personal nutrition goals will be established when you do your nutrition analysis with the dietician.  The following are general nutrition guidelines to follow: Cholesterol < 200mg /day Sodium < 1500mg /day Fiber: Men under  50 yrs - 38 grams per day  Personal Goals: Personal Goals and Risk Factors at Admission - 12/14/18 1622      Core Components/Risk Factors/Patient Goals on Admission    Weight Management  Weight Loss;Obesity    Intervention  Weight Management/Obesity: Establish reasonable short term and long term weight goals.    Admit Weight  229 lb 6.4 oz (104.1 kg)    Goal Weight: Long Term  200 lb (90.7 kg)    Expected Outcomes  Short Term: Continue to assess and modify interventions until short term weight is achieved;Long Term: Adherence to nutrition and physical activity/exercise program aimed toward attainment of established weight goal;Weight Loss: Understanding of general recommendations for a balanced deficit meal plan, which promotes 1-2 lb weight loss per week and includes a negative energy balance of [phone] kcal/d;Weight Maintenance: Understanding of the daily nutrition guidelines, which includes 25-35% calories from fat, 7% or less cal from saturated fats, less than 200mg  cholesterol, less than 1.5gm of sodium, & 5 or more servings of fruits and vegetables daily;Understanding recommendations for meals to include 15-35% energy as protein, 25-35% energy  from fat, 35-60% energy from carbohydrates, less than 200mg  of dietary cholesterol, 20-35 gm of total fiber daily;Understanding of distribution of calorie intake throughout the day with the consumption of 4-5 meals/snacks;Weight Gain: Understanding of general recommendations for a high calorie, high protein meal plan that promotes weight gain by distributing calorie intake throughout the day with the consumption for 4-5 meals, snacks, and/or supplements    Intervention  Provide education and support for participant on nutrition & aerobic/resistive exercise along with prescribed medications to achieve LDL 70mg , HDL >40mg .    Expected Outcomes  Short Term: Participant states understanding of desired cholesterol values and is compliant with medications  prescribed. Participant is following exercise prescription and nutrition guidelines.;Long Term: Cholesterol controlled with medications as prescribed, with individualized exercise RX and with personalized nutrition plan. Value goals: LDL < 70mg , HDL > 40 mg.       Tobacco Use Initial Evaluation: Social History   Tobacco Use  Smoking Status Never Smoker  Smokeless Tobacco Never Used    Exercise Goals and Review: Exercise Goals    Row Name 12/14/18 1620             Exercise Goals   Increase Physical Activity  Yes       Intervention  Provide advice, education, support and counseling about physical activity/exercise needs.;Develop an individualized exercise prescription for aerobic and resistive training based on initial evaluation findings, risk stratification, comorbidities and participant's personal goals.       Expected Outcomes  Short Term: Attend rehab on a regular basis to increase amount of physical activity.;Long Term: Add in home exercise to make exercise part of routine and to increase amount of physical activity.;Long Term: Exercising regularly at least 3-5 days a week.       Increase Strength and Stamina  Yes       Intervention  Provide advice, education, support and counseling about physical activity/exercise needs.;Develop an individualized exercise prescription for aerobic and resistive training based on initial evaluation findings, risk stratification, comorbidities and participant's personal goals.       Expected Outcomes  Short Term: Increase workloads from initial exercise prescription for resistance, speed, and METs.;Short Term: Perform resistance training exercises routinely during rehab and add in resistance training at home;Long Term: Improve cardiorespiratory fitness, muscular endurance and strength as measured by increased METs and functional capacity (6MWT)       Able to understand and use rate of perceived exertion (RPE) scale  Yes       Intervention  Provide  education and explanation on how to use RPE scale       Expected Outcomes  Short Term: Able to use RPE daily in rehab to express subjective intensity level;Long Term:  Able to use RPE to guide intensity level when exercising independently       Knowledge and understanding of Target Heart Rate Range (THRR)  Yes       Intervention  Provide education and explanation of THRR including how the numbers were predicted and where they are located for reference       Expected Outcomes  Short Term: Able to state/look up THRR;Short Term: Able to use daily as guideline for intensity in rehab;Long Term: Able to use THRR to govern intensity when exercising independently       Able to check pulse independently  Yes       Intervention  Provide education and demonstration on how to check pulse in carotid and radial arteries.;Review the importance of being able to  check your own pulse for safety during independent exercise       Expected Outcomes  Short Term: Able to explain why pulse checking is important during independent exercise;Long Term: Able to check pulse independently and accurately       Understanding of Exercise Prescription  Yes       Intervention  Provide education, explanation, and written materials on patient's individual exercise prescription       Expected Outcomes  Short Term: Able to explain program exercise prescription;Long Term: Able to explain home exercise prescription to exercise independently          Copy of goals given to participant.

## 2018-12-16 ENCOUNTER — Other Ambulatory Visit: Payer: Self-pay

## 2018-12-16 ENCOUNTER — Encounter: Payer: BC Managed Care – PPO | Admitting: *Deleted

## 2018-12-16 DIAGNOSIS — Z941 Heart transplant status: Secondary | ICD-10-CM

## 2018-12-16 NOTE — Progress Notes (Signed)
Daily Session Note  Patient Details  Name: Nicholas Weaver MRN: [id number] Date of Birth: August 05, 1975 Referring Provider:     Cardiac Rehab from 12/14/2018 in [facility] Cardiac and Pulmonary Rehab  Referring Provider  McGarrah      Encounter Date: 12/16/2018  Check In: Session Check In - 12/16/18 0733      Check-In   Supervising physician immediately available to respond to emergencies  See telemetry face sheet for immediately available ER MD    Location  [facility]-Cardiac & Pulmonary Rehab    Staff Present  Jasper Loser BS, Exercise Physiologist;Susanne Bice, RN, BSN, CCRP;Jessica Somerset, MA, RCEP, CCRP, CCET    Virtual Visit  No    Medication changes reported      No    Fall or balance concerns reported     No    Warm-up and Cool-down  Performed on first and last piece of equipment    Resistance Training Performed  Yes    VAD Patient?  No    PAD/SET Patient?  No      Pain Assessment   Currently in Pain?  No/denies          Social History   Tobacco Use  Smoking Status Never Smoker  Smokeless Tobacco Never Used    Goals Met:  Exercise tolerated well Personal goals reviewed No report of cardiac concerns or symptoms Strength training completed today  Goals Unmet:  Not Applicable  Comments: First full day of exercise!  Patient was oriented to gym and equipment including functions, settings, policies, and procedures.  Patient's individual exercise prescription and treatment plan were reviewed.  All starting workloads were established based on the results of the 6 minute walk test done at initial orientation visit.  The plan for exercise progression was also introduced and progression will be customized based on patient's performance and goals.    Dr. Emily Filbert is Medical Director for [facility] and [facility].

## 2018-12-18 ENCOUNTER — Other Ambulatory Visit: Payer: Self-pay

## 2018-12-18 DIAGNOSIS — Z941 Heart transplant status: Secondary | ICD-10-CM | POA: Diagnosis not present

## 2018-12-18 NOTE — Progress Notes (Signed)
Daily Session Note  Patient Details  Name: Nicholas Weaver MRN: [id number] Date of Birth: [birth year] Referring Provider:     Cardiac Rehab from 12/14/2018 in [facility] Cardiac and Pulmonary Rehab  Referring Provider  McGarrah      Encounter Date: 12/18/2018  Check In: Session Check In - 12/18/18 0734      Check-In   Supervising physician immediately available to respond to emergencies  See telemetry face sheet for immediately available ER MD    Location  [facility]-Cardiac & Pulmonary Rehab    Staff Present  Vida Rigger RN, BSN;Jessica Luan Pulling, MA, RCEP, CCRP, CCET    Virtual Visit  No    Medication changes reported      No    Fall or balance concerns reported     No    Warm-up and Cool-down  Performed on first and last piece of equipment    Resistance Training Performed  Yes    VAD Patient?  No    PAD/SET Patient?  No      Pain Assessment   Currently in Pain?  No/denies    Multiple Pain Sites  No          Social History   Tobacco Use  Smoking Status Never Smoker  Smokeless Tobacco Never Used    Goals Met:  Independence with exercise equipment Exercise tolerated well No report of cardiac concerns or symptoms Strength training completed today  Goals Unmet:  Not Applicable  Comments: Pt able to follow exercise prescription today without complaint.  Will continue to monitor for progression.   Dr. Emily Filbert is Medical Director for [facility] and [facility].

## 2018-12-21 ENCOUNTER — Encounter: Payer: BC Managed Care – PPO | Admitting: *Deleted

## 2018-12-21 ENCOUNTER — Other Ambulatory Visit: Payer: Self-pay

## 2018-12-21 DIAGNOSIS — Z941 Heart transplant status: Secondary | ICD-10-CM

## 2018-12-21 NOTE — Progress Notes (Signed)
Daily Session Note  Patient Details  Name: Nicholas Weaver MRN: [id number] Date of Birth: 02/14/1976 Referring Provider:     Cardiac Rehab from 12/14/2018 in [facility] Cardiac and Pulmonary Rehab  Referring Provider  McGarrah      Encounter Date: 12/21/2018  Check In: Session Check In - 12/21/18 0743      Check-In   Supervising physician immediately available to respond to emergencies  See telemetry face sheet for immediately available ER MD    Location  [facility]-Cardiac & Pulmonary Rehab    Staff Present  Heath Lark, RN, BSN, Laveda Norman, BS, ACSM CEP, Exercise Physiologist;Jeanna Durrell BS, Exercise Physiologist    Virtual Visit  No    Medication changes reported      No    Fall or balance concerns reported     No    Warm-up and Cool-down  Performed on first and last piece of equipment    Resistance Training Performed  Yes    VAD Patient?  No    PAD/SET Patient?  No      Pain Assessment   Currently in Pain?  No/denies          Social History   Tobacco Use  Smoking Status Never Smoker  Smokeless Tobacco Never Used    Goals Met:  Independence with exercise equipment Exercise tolerated well No report of cardiac concerns or symptoms Strength training completed today  Goals Unmet:  Not Applicable  Comments: Pt able to follow exercise prescription today without complaint.  Will continue to monitor for progression.    Dr. Emily Filbert is Medical Director for [facility] and [facility].

## 2018-12-23 ENCOUNTER — Encounter: Payer: BC Managed Care – PPO | Attending: Internal Medicine | Admitting: *Deleted

## 2018-12-23 ENCOUNTER — Other Ambulatory Visit: Payer: Self-pay

## 2018-12-23 DIAGNOSIS — Z941 Heart transplant status: Secondary | ICD-10-CM

## 2018-12-23 DIAGNOSIS — Z7952 Long term (current) use of systemic steroids: Secondary | ICD-10-CM | POA: Insufficient documentation

## 2018-12-23 DIAGNOSIS — Z7982 Long term (current) use of aspirin: Secondary | ICD-10-CM | POA: Diagnosis not present

## 2018-12-23 DIAGNOSIS — Z79899 Other long term (current) drug therapy: Secondary | ICD-10-CM | POA: Diagnosis not present

## 2018-12-23 DIAGNOSIS — Z7901 Long term (current) use of anticoagulants: Secondary | ICD-10-CM | POA: Insufficient documentation

## 2018-12-23 NOTE — Progress Notes (Signed)
Daily Session Note  Patient Details  Name: Nicholas Weaver MRN: [id number] Date of Birth: 12/27/1975 Referring Provider:     Cardiac Rehab from 12/14/2018 in [facility] Cardiac and Pulmonary Rehab  Referring Provider  McGarrah      Encounter Date: 12/23/2018  Check In: Session Check In - 12/23/18 0755      Check-In   Supervising physician immediately available to respond to emergencies  See telemetry face sheet for immediately available ER MD    Location  [facility]-Cardiac & Pulmonary Rehab    Staff Present  Jessica Hawkins, MA, RCEP, CCRP, CCET;Joseph Hood RCP,RRT,BSRT;Krista Spencer, RN BSN    Virtual Visit  No    Medication changes reported      No    Fall or balance concerns reported     No    Warm-up and Cool-down  Performed on first and last piece of equipment    Resistance Training Performed  Yes    VAD Patient?  No    PAD/SET Patient?  No      Pain Assessment   Currently in Pain?  No/denies          Social History   Tobacco Use  Smoking Status Never Smoker  Smokeless Tobacco Never Used    Goals Met:  Independence with exercise equipment Exercise tolerated well No report of cardiac concerns or symptoms Strength training completed today  Goals Unmet:  Not Applicable  Comments: Pt able to follow exercise prescription today without complaint.  Will continue to monitor for progression.    Dr. Mark Miller is Medical Director for [facility] and [facility]. 

## 2018-12-25 ENCOUNTER — Other Ambulatory Visit: Payer: Self-pay

## 2018-12-25 DIAGNOSIS — Z941 Heart transplant status: Secondary | ICD-10-CM | POA: Diagnosis not present

## 2018-12-25 NOTE — Progress Notes (Signed)
Daily Session Note  Patient Details  Name: Nicholas Weaver MRN: [id number] Date of Birth: 1976/04/11 Referring Provider:     Cardiac Rehab from 12/14/2018 in [facility] Cardiac and Pulmonary Rehab  Referring Provider  McGarrah      Encounter Date: 12/25/2018  Check In: Session Check In - 12/25/18 0734      Check-In   Supervising physician immediately available to respond to emergencies  See telemetry face sheet for immediately available ER MD    Location  [facility]-Cardiac & Pulmonary Rehab    Staff Present  Vida Rigger RN, BSN;Jessica Luan Pulling, MA, RCEP, CCRP, CCET    Virtual Visit  No    Medication changes reported      No    Fall or balance concerns reported     No    Warm-up and Cool-down  Performed on first and last piece of equipment    Resistance Training Performed  Yes    VAD Patient?  No    PAD/SET Patient?  No      Pain Assessment   Currently in Pain?  No/denies    Multiple Pain Sites  No          Social History   Tobacco Use  Smoking Status Never Smoker  Smokeless Tobacco Never Used    Goals Met:  Independence with exercise equipment Exercise tolerated well No report of cardiac concerns or symptoms Strength training completed today  Goals Unmet:  Not Applicable  Comments: Pt able to follow exercise prescription today without complaint.  Will continue to monitor for progression.   Dr. Emily Filbert is Medical Director for [facility] and [facility].

## 2018-12-30 ENCOUNTER — Encounter: Payer: Self-pay | Admitting: *Deleted

## 2018-12-30 ENCOUNTER — Other Ambulatory Visit: Payer: Self-pay

## 2018-12-30 ENCOUNTER — Encounter: Payer: BC Managed Care – PPO | Admitting: *Deleted

## 2018-12-30 DIAGNOSIS — Z941 Heart transplant status: Secondary | ICD-10-CM | POA: Diagnosis not present

## 2018-12-30 NOTE — Progress Notes (Signed)
Daily Session Note  Patient Details  Name: Offie Waide MRN: [id number] Date of Birth: 1975-05-13 Referring Provider:     Cardiac Rehab from 12/14/2018 in [facility] Cardiac and Pulmonary Rehab  Referring Provider  McGarrah      Encounter Date: 12/30/2018  Check In: Session Check In - 12/30/18 0738      Check-In   Supervising physician immediately available to respond to emergencies  See telemetry face sheet for immediately available ER MD    Location  [facility]-Cardiac & Pulmonary Rehab    Staff Present  Heath Lark, RN, BSN, CCRP;Jessica Buchtel, MA, RCEP, CCRP, CCET;Joseph [place] RCP,RRT,BSRT    Virtual Visit  No    Medication changes reported      No    Fall or balance concerns reported     No    Warm-up and Cool-down  Performed on first and last piece of equipment    Resistance Training Performed  Yes    VAD Patient?  No    PAD/SET Patient?  No      Pain Assessment   Currently in Pain?  No/denies          Social History   Tobacco Use  Smoking Status Never Smoker  Smokeless Tobacco Never Used    Goals Met:  Independence with exercise equipment Exercise tolerated well No report of cardiac concerns or symptoms Strength training completed today  Goals Unmet:  Not Applicable  Comments: Pt able to follow exercise prescription today without complaint.  Will continue to monitor for progression.    Dr. Emily Filbert is Medical Director for [place] and [facility].

## 2018-12-30 NOTE — Progress Notes (Signed)
Cardiac Individual Treatment Plan  Patient Details  Name: Nicholas Weaver MRN: [id number] Date of Birth: [birth year] Referring Provider:     Cardiac Rehab from 12/14/2018 in [facility] Cardiac and Pulmonary Rehab  Referring Provider  McGarrah      Initial Encounter Date:    Cardiac Rehab from 12/14/2018 in [facility] Cardiac and Pulmonary Rehab  Date  12/14/18      Visit Diagnosis: Status post heart transplant [facility])  Patient's Home Medications on Admission:  Current Outpatient Medications:  .  allopurinol (ZYLOPRIM) 100 MG tablet, Take by mouth., Disp: , Rfl:  .  amLODipine (NORVASC) 10 MG tablet, Take by mouth., Disp: , Rfl:  .  aspirin EC 81 MG tablet, Take by mouth., Disp: , Rfl:  .  calcium carbonate (CALTRATE 600) 1500 (600 Ca) MG TABS tablet, Take by mouth., Disp: , Rfl:  .  Evolocumab 140 MG/ML SOAJ, Inject into the skin., Disp: , Rfl:  .  metoprolol succinate (TOPROL-XL) 25 MG 24 hr tablet, Take by mouth., Disp: , Rfl:  .  mycophenolate (CELLCEPT) 500 MG tablet, Take by mouth., Disp: , Rfl:  .  pantoprazole (PROTONIX) 40 MG tablet, TAKE 1 TABLET BY MOUTH EVERY DAY, Disp: , Rfl:  .  predniSONE (DELTASONE) 2.5 MG tablet, TAKE 1 TABLET BY MOUTH ONCE DAILY TAKE 10 MG DAILY, THEN PER PREDNISONE TAPER AS DIRECTED., Disp: , Rfl:  .  tacrolimus (PROGRAF) 1 MG capsule, Take by mouth., Disp: , Rfl:  .  torsemide (DEMADEX) 20 MG tablet, Take by mouth., Disp: , Rfl:  .  warfarin (COUMADIN) 3 MG tablet, TAKE 1 TABLET BY MOUTH ONCE A DAY, Disp: , Rfl:   Past Medical History: No past medical history on file.  Tobacco Use: Social History   Tobacco Use  Smoking Status Never Smoker  Smokeless Tobacco Never Used    Labs: Recent Review Flowsheet Data    There is no flowsheet data to display.       Exercise Target Goals: Exercise Program Goal: Individual exercise prescription set using results from initial 6 min walk test and THRR while considering  patient's activity barriers and safety.    Exercise Prescription Goal: Initial exercise prescription builds to 30-45 minutes a day of aerobic activity, 2-3 days per week.  Home exercise guidelines will be given to patient during program as part of exercise prescription that the participant will acknowledge.  Activity Barriers & Risk Stratification: Activity Barriers & Cardiac Risk Stratification - 12/07/18 1243      Activity Barriers & Cardiac Risk Stratification   Activity Barriers  Deconditioning    Cardiac Risk Stratification  High       6 Minute Walk: 6 Minute Walk    Row Name 12/14/18 1615         6 Minute Walk   Distance  1645 feet     Walk Time  6 minutes     # of Rest Breaks  0     MPH  3.11     METS  5.17     RPE  11     Perceived Dyspnea   2     VO2 Peak  18.09     Symptoms  No     Resting HR  97 bpm     Resting BP  124/82     Resting Oxygen Saturation   96 %     Exercise Oxygen Saturation  during 6 min walk  96 %     Max Ex. HR  129 bpm     Max Ex. BP  150/84     2 Minute Post BP  124/78        Oxygen Initial Assessment:   Oxygen Re-Evaluation:   Oxygen Discharge (Final Oxygen Re-Evaluation):   Initial Exercise Prescription: Initial Exercise Prescription - 12/14/18 1600      Date of Initial Exercise RX and Referring Provider   Date  12/14/18    Referring Provider  McGarrah      Treadmill   MPH  3.1    Grade  1.5    Minutes  15    METs  4      NuStep   Level  4    SPM  80    Minutes  15    METs  4      REL-XR   Level  3    Speed  50    Minutes  15    METs  4      Prescription Details   Frequency (times per week)  3    Duration  Progress to 30 minutes of continuous aerobic without signs/symptoms of physical distress      Intensity   THRR 40-80% of Max Heartrate  128-160    Ratings of Perceived Exertion  11-15    Perceived Dyspnea  0-4      Resistance Training   Training Prescription  Yes    Weight  3 lb    Reps  10-15       Perform Capillary Blood Glucose  checks as needed.  Exercise Prescription Changes: Exercise Prescription Changes    Row Name 12/14/18 1600             Response to Exercise   Blood Pressure (Admit)  124/82       Blood Pressure (Exercise)  150/84       Blood Pressure (Exit)  124/78       Heart Rate (Admit)  97 bpm       Heart Rate (Exercise)  129 bpm       Heart Rate (Exit)  105 bpm       Oxygen Saturation (Admit)  96 %       Oxygen Saturation (Exercise)  96 %       Rating of Perceived Exertion (Exercise)  11       Perceived Dyspnea (Exercise)  2       Symptoms  none          Exercise Comments: Exercise Comments    Row Name 12/16/18 [phone]           Exercise Comments  First full day of exercise!  Patient was oriented to gym and equipment including functions, settings, policies, and procedures.  Patient's individual exercise prescription and treatment plan were reviewed.  All starting workloads were established based on the results of the 6 minute walk test done at initial orientation visit.  The plan for exercise progression was also introduced and progression will be customized based on patient's performance and goals.          Exercise Goals and Review: Exercise Goals    Row Name 12/14/18 1620             Exercise Goals   Increase Physical Activity  Yes       Intervention  Provide advice, education, support and counseling about physical activity/exercise needs.;Develop an individualized exercise prescription for aerobic and resistive training based on initial evaluation findings, risk stratification, comorbidities  and participant's personal goals.       Expected Outcomes  Short Term: Attend rehab on a regular basis to increase amount of physical activity.;Long Term: Add in home exercise to make exercise part of routine and to increase amount of physical activity.;Long Term: Exercising regularly at least 3-5 days a week.       Increase Strength and Stamina  Yes       Intervention  Provide advice,  education, support and counseling about physical activity/exercise needs.;Develop an individualized exercise prescription for aerobic and resistive training based on initial evaluation findings, risk stratification, comorbidities and participant's personal goals.       Expected Outcomes  Short Term: Increase workloads from initial exercise prescription for resistance, speed, and METs.;Short Term: Perform resistance training exercises routinely during rehab and add in resistance training at home;Long Term: Improve cardiorespiratory fitness, muscular endurance and strength as measured by increased METs and functional capacity (6MWT)       Able to understand and use rate of perceived exertion (RPE) scale  Yes       Intervention  Provide education and explanation on how to use RPE scale       Expected Outcomes  Short Term: Able to use RPE daily in rehab to express subjective intensity level;Long Term:  Able to use RPE to guide intensity level when exercising independently       Knowledge and understanding of Target Heart Rate Range (THRR)  Yes       Intervention  Provide education and explanation of THRR including how the numbers were predicted and where they are located for reference       Expected Outcomes  Short Term: Able to state/look up THRR;Short Term: Able to use daily as guideline for intensity in rehab;Long Term: Able to use THRR to govern intensity when exercising independently       Able to check pulse independently  Yes       Intervention  Provide education and demonstration on how to check pulse in carotid and radial arteries.;Review the importance of being able to check your own pulse for safety during independent exercise       Expected Outcomes  Short Term: Able to explain why pulse checking is important during independent exercise;Long Term: Able to check pulse independently and accurately       Understanding of Exercise Prescription  Yes       Intervention  Provide education, explanation,  and written materials on patient's individual exercise prescription       Expected Outcomes  Short Term: Able to explain program exercise prescription;Long Term: Able to explain home exercise prescription to exercise independently          Exercise Goals Re-Evaluation : Exercise Goals Re-Evaluation    Row Name 12/16/18 0740             Exercise Goal Re-Evaluation   Exercise Goals Review  Increase Physical Activity;Increase Strength and Stamina;Able to understand and use rate of perceived exertion (RPE) scale;Knowledge and understanding of Target Heart Rate Range (THRR);Understanding of Exercise Prescription       Comments  Reviewed RPE scale, THR and program prescription with pt today.  Pt voiced understanding and was given a copy of goals to take home.       Expected Outcomes  Short: Use RPE daily to regulate intensity. Long: Follow program prescription in THR.          Discharge Exercise Prescription (Final Exercise Prescription Changes): Exercise Prescription Changes - 12/14/18  1600      Response to Exercise   Blood Pressure (Admit)  124/82    Blood Pressure (Exercise)  150/84    Blood Pressure (Exit)  124/78    Heart Rate (Admit)  97 bpm    Heart Rate (Exercise)  129 bpm    Heart Rate (Exit)  105 bpm    Oxygen Saturation (Admit)  96 %    Oxygen Saturation (Exercise)  96 %    Rating of Perceived Exertion (Exercise)  11    Perceived Dyspnea (Exercise)  2    Symptoms  none       Nutrition:  Target Goals: Understanding of nutrition guidelines, daily intake of sodium <1543m, cholesterol <2032m calories 30% from fat and 7% or less from saturated fats, daily to have 5 or more servings of fruits and vegetables.  Biometrics: Pre Biometrics - 12/14/18 1620      Pre Biometrics   Height  5' 11.25" (1.81 m)    Weight  229 lb 6.4 oz (104.1 kg)    BMI (Calculated)  31.76        Nutrition Therapy Plan and Nutrition Goals: Nutrition Therapy & Goals - 12/14/18 1652       Nutrition Therapy   Diet  Low Na HH diet    Drug/Food Interactions  Coumadin/Vit K    Protein (specify units)  84g    Fiber  30 grams    Whole Grain Foods  3 servings    Saturated Fats  12 max. grams    Fruits and Vegetables  5 servings/day    Sodium  1.5 grams      Personal Nutrition Goals   Nutrition Goal  ST: lower Na (change B and L - oatmeal and sandwhich respectively) LT: be able to be there for his kids, increase functionality and functional life.    Comments  Pt is very motivated; encouraged small changes that build up over time. Pt on fluid pill but doesnt check swelling or weight, stressed the importance of that. Pt reports that fluid recommendations by doctors is 2L. Pt reports wanting to reduce Na first due to breathing and fluid; told pt that it was important but also a piece to the puzzle as there are other factors in his diet such as eating healthy fat, fiber, healthy CHOs. Discussed fiber needs and how to increase, discussed whole grain foods, HH eating and low Na eating told pt 1500-200067ms a good range to stay in and 2000m69my be more realistic; also discussed importance of getting enough Na - don't want to go too low in Na. Discussed set point and the importance of healthy outcomes and eating enough for his heart (try not to restrict calories past needs). Pt was taking notes and seemed to be ready for changes; some language regarding diet and all or nothing language was noticed. Discussed again small changes and sustainability - this is for long term. Pt reports eating out 3 meals per day (bacon bisuit for B, chick-fil-a for L, and take out for dinner). Pt reports likeing mustanrd and fish now where before he didnt.      Intervention Plan   Intervention  Prescribe, educate and counsel regarding individualized specific dietary modifications aiming towards targeted core components such as weight, hypertension, lipid management, diabetes, heart failure and other  comorbidities.;Nutrition handout(s) given to patient.    Expected Outcomes  Short Term Goal: Understand basic principles of dietary content, such as calories, fat, sodium, cholesterol and nutrients.;Short Term  Goal: A plan has been developed with personal nutrition goals set during dietitian appointment.;Long Term Goal: Adherence to prescribed nutrition plan.       Nutrition Assessments: Nutrition Assessments - 12/07/18 1718      MEDFICTS Scores   Pre Score  125       Nutrition Goals Re-Evaluation:   Nutrition Goals Discharge (Final Nutrition Goals Re-Evaluation):   Psychosocial: Target Goals: Acknowledge presence or absence of significant depression and/or stress, maximize coping skills, provide positive support system. Participant is able to verbalize types and ability to use techniques and skills needed for reducing stress and depression.   Initial Review & Psychosocial Screening: Initial Psych Review & Screening - 12/07/18 1244      Initial Review   Current issues with  Current Sleep Concerns   Not sleeping as well in past 2 months.  Waking up frequently.     Family Dynamics   Good Support System?  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      Screening Interventions   Interventions  Encouraged to exercise;To provide support and resources with identified psychosocial needs;Provide feedback about the scores to participant    Expected Outcomes  Short Term goal: Utilizing psychosocial counselor, staff and physician to assist with identification of specific Stressors or current issues interfering with healing process. Setting desired goal for each stressor or current issue identified.;Long Term Goal: Stressors or current issues are controlled or  eliminated.;Short Term goal: Identification and review with participant of any Quality of Life or Depression concerns found by scoring the questionnaire.;Long Term goal: The participant improves quality of Life and PHQ9 Scores as seen by post scores and/or verbalization of changes       Quality of Life Scores:  Quality of Life - 12/07/18 1716      Quality of Life   Select  Quality of Life      Quality of Life Scores   Health/Function Pre  15.8 %    Socioeconomic Pre  18.56 %    Psych/Spiritual Pre  17.07 %    Family Pre  27.6 %    GLOBAL Pre  18.37 %      Scores of 19 and below usually indicate a poorer quality of life in these areas.  A difference of  2-3 points is a clinically meaningful difference.  A difference of 2-3 points in the total score of the Quality of Life Index has been associated with significant improvement in overall quality of life, self-image, physical symptoms, and general health in studies assessing change in quality of life.  PHQ-9: Recent Review Flowsheet Data    Depression screen [facility] 2/9 12/14/2018   Decreased Interest 2   Down, Depressed, Hopeless 1   PHQ - 2 Score 3   Altered sleeping 2   Tired, decreased energy 2   Change in appetite 1   Feeling bad or failure about yourself  3   Trouble concentrating 1   Moving slowly or fidgety/restless 0   Suicidal thoughts 0   PHQ-9 Score 12   Difficult doing work/chores Somewhat difficult     Interpretation of Total Score  Total Score Depression Severity:  1-4 = Minimal depression, 5-9 = Mild depression, 10-14 = Moderate depression, 15-19 = Moderately severe depression, 20-27 =  Severe depression   Psychosocial Evaluation and Intervention: Psychosocial Evaluation - 12/07/18 1712      Psychosocial Evaluation & Interventions   Interventions  Relaxation education;Stress management education;Encouraged to exercise with the program and follow exercise prescription    Comments  Saabir has no barriers to the  program. He does state that he has some sleep concerns. Waking up to go to the bathroom and not falling asleep immediately, occuring in the past few months. 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 Will review QOL scores with Talin during his next visit. Zeeshan feels deconditioned and hopes to see improvement in his exercise capacity..    Expected Outcomes  STG: Meer will see improvement in energy levels and hopefully sleep pattern.  :LTG: continued improvements and higher QOL scores at discharge    Continue Psychosocial Services   Follow up required by staff       Psychosocial Re-Evaluation:   Psychosocial Discharge (Final Psychosocial Re-Evaluation):   Vocational Rehabilitation: Provide vocational rehab assistance to qualifying candidates.   Vocational Rehab Evaluation & Intervention: Vocational Rehab - 12/07/18 1248      Initial Vocational Rehab Evaluation & Intervention   Assessment shows need for Vocational Rehabilitation  No       Education: Education Goals: Education classes will be provided on a variety of topics geared toward better understanding of heart health and risk factor modification. Participant will state understanding/return demonstration of topics presented as noted by education test scores.  Learning Barriers/Preferences: Learning Barriers/Preferences - 12/07/18 1248      Learning Barriers/Preferences   Learning Barriers  None    Learning Preferences  None       Education Topics:  AED/CPR: - Group verbal and written instruction with the use of models to demonstrate the basic use of the AED with the basic ABC's of resuscitation.   General Nutrition Guidelines/Fats and Fiber: -Group instruction provided by verbal, written material, models and posters to present the general guidelines for heart healthy nutrition. Gives an explanation and review of dietary fats and  fiber.   Controlling Sodium/Reading Food Labels: -Group verbal and written material supporting the discussion of sodium use in heart healthy nutrition. Review and explanation with models, verbal and written materials for utilization of the food label.   Exercise Physiology & General Exercise Guidelines: - Group verbal and written instruction with models to review the exercise physiology of the cardiovascular system and associated critical values. Provides general exercise guidelines with specific guidelines to those with heart or lung disease.    Aerobic Exercise & Resistance Training: - Gives group verbal and written instruction on the various components of exercise. Focuses on aerobic and resistive training programs and the benefits of this training and how to safely progress through these programs..   Flexibility, Balance, Mind/Body Relaxation: Provides group verbal/written instruction on the benefits of flexibility and balance training, including mind/body exercise modes such as yoga, pilates and tai chi.  Demonstration and skill practice provided.   Stress and Anxiety: - Provides group verbal and written instruction about the health risks of elevated stress and causes of high stress.  Discuss the correlation between heart/lung disease and anxiety and treatment options. Review healthy ways to manage with stress and anxiety.   Depression: - Provides group verbal and written instruction on the correlation between heart/lung disease and depressed mood, treatment options, and the stigmas associated with seeking treatment.  Anatomy & Physiology of the Heart: - Group verbal and written instruction and models provide basic cardiac anatomy and physiology, with the coronary electrical and arterial systems. Review of Valvular disease and Heart Failure   Cardiac Procedures: - Group verbal and written instruction to review commonly prescribed medications for heart disease. Reviews the  medication, class of the drug, and side effects. Includes the steps to properly store meds and maintain the prescription regimen. (beta blockers and nitrates)   Cardiac Medications I: - Group verbal and written instruction to review commonly prescribed medications for heart disease. Reviews the medication, class of the drug, and side effects. Includes the steps to properly store meds and maintain the prescription regimen.   Cardiac Medications II: -Group verbal and written instruction to review commonly prescribed medications for heart disease. Reviews the medication, class of the drug, and side effects. (all other drug classes)    Go Sex-Intimacy & Heart Disease, Get SMART - Goal Setting: - Group verbal and written instruction through game format to discuss heart disease and the return to sexual intimacy. Provides group verbal and written material to discuss and apply goal setting through the application of the S.M.A.R.T. Method.   Other Matters of the Heart: - Provides group verbal, written materials and models to describe Stable Angina and Peripheral Artery. Includes description of the disease process and treatment options available to the cardiac patient.   Exercise & Equipment Safety: - Individual verbal instruction and demonstration of equipment use and safety with use of the equipment.   Cardiac Rehab from 12/14/2018 in [facility] Cardiac and Pulmonary Rehab  Date  12/14/18  Educator  AS  Instruction Review Code  1- Verbalizes Understanding      Infection Prevention: - Provides verbal and written material to individual with discussion of infection control including proper hand washing and proper equipment cleaning during exercise session.   Cardiac Rehab from 12/14/2018 in [facility] Cardiac and Pulmonary Rehab  Date  12/14/18  Educator  AS  Instruction Review Code  1- Verbalizes Understanding      Falls Prevention: - Provides verbal and written material to individual with discussion  of falls prevention and safety.   Diabetes: - Individual verbal and written instruction to review signs/symptoms of diabetes, desired ranges of glucose level fasting, after meals and with exercise. Acknowledge that pre and post exercise glucose checks will be done for 3 sessions at entry of program.   Know Your Numbers and Risk Factors: -Group verbal and written instruction about important numbers in your health.  Discussion of what are risk factors and how they play a role in the disease process.  Review of Cholesterol, Blood Pressure, Diabetes, and BMI and the role they play in your overall health.   Sleep Hygiene: -Provides group verbal and written instruction about how sleep can affect your health.  Define sleep hygiene, discuss sleep cycles and impact of sleep habits. Review good sleep hygiene tips.    Other: -Provides group and verbal instruction on various topics (see comments)   Knowledge Questionnaire Score: Knowledge Questionnaire Score - 12/07/18 1718      Knowledge Questionnaire Score   Pre Score  23/26  MIssed angina, nutrition and exercise questions       Core Components/Risk Factors/Patient Goals at Admission: Personal Goals and Risk Factors at Admission - 12/14/18 1622      Core Components/Risk Factors/Patient Goals on Admission    Weight Management  Weight Loss;Obesity    Intervention  Weight Management/Obesity: Establish reasonable short term  and long term weight goals.    Admit Weight  229 lb 6.4 oz (104.1 kg)    Goal Weight: Long Term  200 lb (90.7 kg)    Expected Outcomes  Short Term: Continue to assess and modify interventions until short term weight is achieved;Long Term: Adherence to nutrition and physical activity/exercise program aimed toward attainment of established weight goal;Weight Loss: Understanding of general recommendations for a balanced deficit meal plan, which promotes 1-2 lb weight loss per week and includes a negative energy balance of  [id number] kcal/d;Weight Maintenance: Understanding of the daily nutrition guidelines, which includes 25-35% calories from fat, 7% or less cal from saturated fats, less than 258m cholesterol, less than 1.5gm of sodium, & 5 or more servings of fruits and vegetables daily;Understanding recommendations for meals to include 15-35% energy as protein, 25-35% energy from fat, 35-60% energy from carbohydrates, less than 2073mof dietary cholesterol, 20-35 gm of total fiber daily;Understanding of distribution of calorie intake throughout the day with the consumption of 4-5 meals/snacks;Weight Gain: Understanding of general recommendations for a high calorie, high protein meal plan that promotes weight gain by distributing calorie intake throughout the day with the consumption for 4-5 meals, snacks, and/or supplements    Intervention  Provide education and support for participant on nutrition & aerobic/resistive exercise along with prescribed medications to achieve LDL <7041mHDL >53m64m  Expected Outcomes  Short Term: Participant states understanding of desired cholesterol values and is compliant with medications prescribed. Participant is following exercise prescription and nutrition guidelines.;Long Term: Cholesterol controlled with medications as prescribed, with individualized exercise RX and with personalized nutrition plan. Value goals: LDL < 70mg52mL > 40 mg.       Core Components/Risk Factors/Patient Goals Review:    Core Components/Risk Factors/Patient Goals at Discharge (Final Review):    ITP Comments: ITP Comments    Row Name 12/07/18 1432 12/14/18 1614 12/30/18 0553       ITP Comments  Virtual Orientation completed   Appt made for 8/24 to complete EP/RD Evals and gym orientation  Completed initital 6MWT and nutrition eval.  ITP created  and sent for review  30 Day review. Continue with ITP unless directed changes per Medical Director review.   New to program        Comments:

## 2019-01-01 ENCOUNTER — Encounter: Payer: BC Managed Care – PPO | Admitting: *Deleted

## 2019-01-01 ENCOUNTER — Other Ambulatory Visit: Payer: Self-pay

## 2019-01-01 DIAGNOSIS — Z941 Heart transplant status: Secondary | ICD-10-CM

## 2019-01-01 NOTE — Progress Notes (Signed)
Daily Session Note  Patient Details  Name: Nicholas Weaver MRN: [id number] Date of Birth: 12-04-1975 Referring Provider:     Cardiac Rehab from 12/14/2018 in [facility] Cardiac and Pulmonary Rehab  Referring Provider  McGarrah      Encounter Date: 01/01/2019  Check In: Session Check In - 01/01/19 0758      Check-In   Supervising physician immediately available to respond to emergencies  See telemetry face sheet for immediately available ER MD    Location  [facility]-Cardiac & Pulmonary Rehab    Staff Present  Heath Lark, RN, BSN, CCRP;Amanda Sommer, BA, ACSM CEP, Exercise Physiologist;Jessica [place], MA, RCEP, CCRP, CCET    Virtual Visit  No    Medication changes reported      No    Fall or balance concerns reported     No    Warm-up and Cool-down  Performed on first and last piece of equipment    Resistance Training Performed  Yes    VAD Patient?  No    PAD/SET Patient?  No      Pain Assessment   Currently in Pain?  No/denies          Social History   Tobacco Use  Smoking Status Never Smoker  Smokeless Tobacco Never Used    Goals Met:  Independence with exercise equipment Exercise tolerated well No report of cardiac concerns or symptoms Strength training completed today  Goals Unmet:  Not Applicable  Comments: Reviewed home exercise with pt today.  Pt plans to join a gym for exercise.  Reviewed THR, pulse, RPE, sign and symptoms, NTG use, and when to call 911 or MD.  Also discussed weather considerations and indoor options.  Pt voiced understanding.  Pt able to follow exercise prescription today without complaint.  Will continue to monitor for progression.    Dr. Emily Filbert is Medical Director for [place] and [facility].

## 2019-01-04 ENCOUNTER — Encounter: Payer: BC Managed Care – PPO | Admitting: *Deleted

## 2019-01-04 ENCOUNTER — Other Ambulatory Visit: Payer: Self-pay

## 2019-01-04 DIAGNOSIS — Z941 Heart transplant status: Secondary | ICD-10-CM

## 2019-01-04 NOTE — Progress Notes (Signed)
Daily Session Note  Patient Details  Name: Nicholas Weaver MRN: [id number] Date of Birth: Oct 08, 1975 Referring Provider:     Cardiac Rehab from 12/14/2018 in [facility] Cardiac and Pulmonary Rehab  Referring Provider  McGarrah      Encounter Date: 01/04/2019  Check In: Session Check In - 01/04/19 0739      Check-In   Supervising physician immediately available to respond to emergencies  See telemetry face sheet for immediately available ER MD    Location  [facility]-Cardiac & Pulmonary Rehab    Staff Present  Heath Lark, RN, BSN, Laveda Norman, BS, ACSM CEP, Exercise Physiologist;Joseph Tessie Fass RCP,RRT,BSRT    Virtual Visit  No    Medication changes reported      No    Fall or balance concerns reported     No    Warm-up and Cool-down  Performed on first and last piece of equipment    Resistance Training Performed  Yes    VAD Patient?  No    PAD/SET Patient?  No      Pain Assessment   Currently in Pain?  No/denies          Social History   Tobacco Use  Smoking Status Never Smoker  Smokeless Tobacco Never Used    Goals Met:  Independence with exercise equipment Exercise tolerated well Personal goals reviewed No report of cardiac concerns or symptoms  Goals Unmet:  Not Applicable  Comments: Pt able to follow exercise prescription today without complaint.  Will continue to monitor for progression.    Dr. Emily Filbert is Medical Director for [place] and [facility].

## 2019-01-11 ENCOUNTER — Telehealth: Payer: Self-pay | Admitting: *Deleted

## 2019-01-11 ENCOUNTER — Encounter: Payer: Self-pay | Admitting: *Deleted

## 2019-01-11 DIAGNOSIS — Z941 Heart transplant status: Secondary | ICD-10-CM

## 2019-01-11 NOTE — Progress Notes (Signed)
Nicholas Weaver called to let us know he had a cath last week and now has swelling/hematoma in groin. Md has checked this and is watching it.  Sargent will return when able to exercise.

## 2019-01-11 NOTE — Telephone Encounter (Signed)
Nicholas Weaver called to let us know he had a cath last week and now has swelling/hematoma in groin. Md has checked this and is watching it.  Nicholas Weaver will return when able to exercise. 

## 2019-01-13 ENCOUNTER — Telehealth: Payer: Self-pay | Admitting: *Deleted

## 2019-01-13 NOTE — Telephone Encounter (Signed)
Nicholas Weaver is continuing to have concerns with the hematoma that developed after his cath.  He plans to return when it gets better and he feels he can exercise.

## 2019-01-15 ENCOUNTER — Other Ambulatory Visit: Payer: Self-pay

## 2019-01-15 DIAGNOSIS — Z941 Heart transplant status: Secondary | ICD-10-CM

## 2019-01-15 NOTE — Progress Notes (Signed)
Daily Session Note  Patient Details  Name: Darious Rehman MRN: [id number] Date of Birth: 09-21-1975 Referring Provider:     Cardiac Rehab from 12/14/2018 in [facility] Cardiac and Pulmonary Rehab  Referring Provider  McGarrah      Encounter Date: 01/15/2019  Check In: Session Check In - 01/15/19 0731      Check-In   Supervising physician immediately available to respond to emergencies  See telemetry face sheet for immediately available ER MD    Location  [facility]-Cardiac & Pulmonary Rehab    Staff Present  Vida Rigger RN, BSN;Jessica Luan Pulling, MA, RCEP, CCRP, CCET    Virtual Visit  No    Medication changes reported      No    Fall or balance concerns reported     No    Warm-up and Cool-down  Performed on first and last piece of equipment    Resistance Training Performed  Yes    VAD Patient?  No    PAD/SET Patient?  No      Pain Assessment   Currently in Pain?  No/denies    Multiple Pain Sites  No          Social History   Tobacco Use  Smoking Status Never Smoker  Smokeless Tobacco Never Used    Goals Met:  Independence with exercise equipment Exercise tolerated well No report of cardiac concerns or symptoms Strength training completed today  Goals Unmet:  Not Applicable  Comments: Pt able to follow exercise prescription today without complaint.  Will continue to monitor for progression.   Dr. Emily Filbert is Medical Director for [place] and [facility].

## 2019-01-18 ENCOUNTER — Other Ambulatory Visit: Payer: Self-pay

## 2019-01-18 ENCOUNTER — Encounter: Payer: BC Managed Care – PPO | Admitting: *Deleted

## 2019-01-18 DIAGNOSIS — Z941 Heart transplant status: Secondary | ICD-10-CM | POA: Diagnosis not present

## 2019-01-18 NOTE — Progress Notes (Signed)
Daily Session Note  Patient Details  Name: Kacy Conely MRN: [id number] Date of Birth: 1975-11-13 Referring Provider:     Cardiac Rehab from 12/14/2018 in [facility] Cardiac and Pulmonary Rehab  Referring Provider  McGarrah      Encounter Date: 01/18/2019  Check In: Session Check In - 01/18/19 0727      Check-In   Supervising physician immediately available to respond to emergencies  See telemetry face sheet for immediately available ER MD    Location  [facility]-Cardiac & Pulmonary Rehab    Staff Present  Alberteen Sam, MA, RCEP, CCRP, CCET;Myles Tavella, RN, BSN-BC, CCRP    Virtual Visit  No    Medication changes reported      No    Fall or balance concerns reported     No    Tobacco Cessation  No Change    Warm-up and Cool-down  Performed on first and last piece of equipment    Resistance Training Performed  Yes    VAD Patient?  No    PAD/SET Patient?  No      Pain Assessment   Currently in Pain?  No/denies          Social History   Tobacco Use  Smoking Status Never Smoker  Smokeless Tobacco Never Used    Goals Met:  Proper associated with RPD/PD & O2 Sat Exercise tolerated well  Goals Unmet:  Not Applicable  Comments: Pt able to follow exercise prescription today without complaint.  Will continue to monitor for progression.    Dr. Emily Filbert is Medical Director for [place] and [facility].

## 2019-01-20 ENCOUNTER — Encounter: Payer: BC Managed Care – PPO | Admitting: *Deleted

## 2019-01-20 ENCOUNTER — Other Ambulatory Visit: Payer: Self-pay

## 2019-01-20 DIAGNOSIS — Z941 Heart transplant status: Secondary | ICD-10-CM

## 2019-01-20 NOTE — Progress Notes (Signed)
Daily Session Note  Patient Details  Name: Nicholas Weaver MRN: [id number] Date of Birth: 1976/01/03 Referring Provider:     Cardiac Rehab from 12/14/2018 in [facility] Cardiac and Pulmonary Rehab  Referring Provider  McGarrah      Encounter Date: 01/20/2019  Check In: Session Check In - 01/20/19 0746      Check-In   Supervising physician immediately available to respond to emergencies  See telemetry face sheet for immediately available ER MD    Location  [facility]-Cardiac & Pulmonary Rehab    Staff Present  Darel Hong, RN BSN;Joseph [address] [place], [place], RCEP, CCRP, CCET    Virtual Visit  No    Medication changes reported      No    Fall or balance concerns reported     No    Warm-up and Cool-down  Performed on first and last piece of equipment    Resistance Training Performed  Yes    VAD Patient?  No    PAD/SET Patient?  No      Pain Assessment   Currently in Pain?  No/denies          Social History   Tobacco Use  Smoking Status Never Smoker  Smokeless Tobacco Never Used    Goals Met:  Independence with exercise equipment Exercise tolerated well No report of cardiac concerns or symptoms Strength training completed today  Goals Unmet:  Not Applicable  Comments: Pt able to follow exercise prescription today without complaint.  Will continue to monitor for progression.    Dr. Emily Filbert is Medical Director for [place] and [facility].

## 2019-01-22 ENCOUNTER — Encounter: Payer: BC Managed Care – PPO | Attending: Internal Medicine

## 2019-01-22 DIAGNOSIS — Z7901 Long term (current) use of anticoagulants: Secondary | ICD-10-CM | POA: Insufficient documentation

## 2019-01-22 DIAGNOSIS — Z7982 Long term (current) use of aspirin: Secondary | ICD-10-CM | POA: Insufficient documentation

## 2019-01-22 DIAGNOSIS — Z79899 Other long term (current) drug therapy: Secondary | ICD-10-CM | POA: Insufficient documentation

## 2019-01-22 DIAGNOSIS — Z7952 Long term (current) use of systemic steroids: Secondary | ICD-10-CM | POA: Insufficient documentation

## 2019-01-22 DIAGNOSIS — Z941 Heart transplant status: Secondary | ICD-10-CM | POA: Insufficient documentation

## 2019-01-27 ENCOUNTER — Encounter: Payer: Self-pay | Admitting: *Deleted

## 2019-01-27 DIAGNOSIS — Z941 Heart transplant status: Secondary | ICD-10-CM

## 2019-01-27 NOTE — Progress Notes (Signed)
Cardiac Individual Treatment Plan  Patient Details  Name: Nicholas Weaver MRN: [id number] Date of Birth: [birth year] Referring Provider:     Cardiac Rehab from 12/14/2018 in [facility] Cardiac and Pulmonary Rehab  Referring Provider  McGarrah      Initial Encounter Date:    Cardiac Rehab from 12/14/2018 in [facility] Cardiac and Pulmonary Rehab  Date  12/14/18      Visit Diagnosis: Status post heart transplant [facility])  Patient's Home Medications on Admission:  Current Outpatient Medications:  .  allopurinol (ZYLOPRIM) 100 MG tablet, Take by mouth., Disp: , Rfl:  .  amLODipine (NORVASC) 10 MG tablet, Take by mouth., Disp: , Rfl:  .  aspirin EC 81 MG tablet, Take by mouth., Disp: , Rfl:  .  calcium carbonate (CALTRATE 600) 1500 (600 Ca) MG TABS tablet, Take by mouth., Disp: , Rfl:  .  Evolocumab 140 MG/ML SOAJ, Inject into the skin., Disp: , Rfl:  .  metoprolol succinate (TOPROL-XL) 25 MG 24 hr tablet, Take by mouth., Disp: , Rfl:  .  mycophenolate (CELLCEPT) 500 MG tablet, Take by mouth., Disp: , Rfl:  .  pantoprazole (PROTONIX) 40 MG tablet, TAKE 1 TABLET BY MOUTH EVERY DAY, Disp: , Rfl:  .  predniSONE (DELTASONE) 2.5 MG tablet, TAKE 1 TABLET BY MOUTH ONCE DAILY TAKE 10 MG DAILY, THEN PER PREDNISONE TAPER AS DIRECTED., Disp: , Rfl:  .  tacrolimus (PROGRAF) 1 MG capsule, Take by mouth., Disp: , Rfl:  .  torsemide (DEMADEX) 20 MG tablet, Take by mouth., Disp: , Rfl:  .  warfarin (COUMADIN) 3 MG tablet, TAKE 1 TABLET BY MOUTH ONCE A DAY, Disp: , Rfl:   Past Medical History: No past medical history on file.  Tobacco Use: Social History   Tobacco Use  Smoking Status Never Smoker  Smokeless Tobacco Never Used    Labs: Recent Review Flowsheet Data    There is no flowsheet data to display.       Exercise Target Goals: Exercise Program Goal: Individual exercise prescription set using results from initial 6 min walk test and THRR while considering  patient's activity barriers and safety.    Exercise Prescription Goal: Initial exercise prescription builds to 30-45 minutes a day of aerobic activity, 2-3 days per week.  Home exercise guidelines will be given to patient during program as part of exercise prescription that the participant will acknowledge.  Activity Barriers & Risk Stratification: Activity Barriers & Cardiac Risk Stratification - 12/07/18 1243      Activity Barriers & Cardiac Risk Stratification   Activity Barriers  Deconditioning    Cardiac Risk Stratification  High       6 Minute Walk: 6 Minute Walk    Row Name 12/14/18 1615         6 Minute Walk   Distance  1645 feet     Walk Time  6 minutes     # of Rest Breaks  0     MPH  3.11     METS  5.17     RPE  11     Perceived Dyspnea   2     VO2 Peak  18.09     Symptoms  No     Resting HR  97 bpm     Resting BP  124/82     Resting Oxygen Saturation   96 %     Exercise Oxygen Saturation  during 6 min walk  96 %     Max Ex. HR  129 bpm     Max Ex. BP  150/84     2 Minute Post BP  124/78        Oxygen Initial Assessment:   Oxygen Re-Evaluation:   Oxygen Discharge (Final Oxygen Re-Evaluation):   Initial Exercise Prescription: Initial Exercise Prescription - 12/14/18 1600      Date of Initial Exercise RX and Referring Provider   Date  12/14/18    Referring Provider  McGarrah      Treadmill   MPH  3.1    Grade  1.5    Minutes  15    METs  4      NuStep   Level  4    SPM  80    Minutes  15    METs  4      REL-XR   Level  3    Speed  50    Minutes  15    METs  4      Prescription Details   Frequency (times per week)  3    Duration  Progress to 30 minutes of continuous aerobic without signs/symptoms of physical distress      Intensity   THRR 40-80% of Max Heartrate  128-160    Ratings of Perceived Exertion  11-15    Perceived Dyspnea  0-4      Resistance Training   Training Prescription  Yes    Weight  3 lb    Reps  10-15       Perform Capillary Blood Glucose  checks as needed.  Exercise Prescription Changes: Exercise Prescription Changes    Row Name 12/14/18 1600 12/30/18 1300 01/12/19 1500         Response to Exercise   Blood Pressure (Admit)  124/82  154/74  120/80     Blood Pressure (Exercise)  150/84  156/80  142/82     Blood Pressure (Exit)  124/78  134/70  132/82     Heart Rate (Admit)  97 bpm  86 bpm  96 bpm     Heart Rate (Exercise)  129 bpm  130 bpm  124 bpm     Heart Rate (Exit)  105 bpm  103 bpm  99 bpm     Oxygen Saturation (Admit)  96 %  -  -     Oxygen Saturation (Exercise)  96 %  -  -     Rating of Perceived Exertion (Exercise)  '11  14  12     '$ Perceived Dyspnea (Exercise)  2  -  -     Symptoms  none  none  none     Duration  -  -  Progress to 30 minutes of  aerobic without signs/symptoms of physical distress     Intensity  -  -  THRR unchanged       Progression   Progression  -  -  Continue to progress workloads to maintain intensity without signs/symptoms of physical distress.     Average METs  -  -  5.97       Resistance Training   Training Prescription  -  Yes  Yes     Weight  -  3 lb  6 lbs     Reps  -  10-15  10-15       Interval Training   Interval Training  -  -  Yes     Equipment  -  -  NuStep;Recumbant Elliptical     Comments  -  -  2 min off 1 min on       Treadmill   MPH  -  3  3     Grade  -  1 to 8.5 intervals  8.5     Minutes  -  15  15     METs  -  3.7 6 + METS intervals  6.5       NuStep   Level  -  4  4     SPM  -  80  -     Minutes  -  15  15     METs  -  9.8  7.3       REL-XR   Level  -  -  4     Minutes  -  -  15     METs  -  -  3.8       Home Exercise Plan   Plans to continue exercise at  -  -  Home (comment) walking, join a gym     Frequency  -  -  Add 2 additional days to program exercise sessions.     Initial Home Exercises Provided  -  -  12/30/18        Exercise Comments: Exercise Comments    Row Name 12/16/18 [phone] 01/11/19 1253 01/15/19 0741       Exercise Comments   First full day of exercise!  Patient was oriented to gym and equipment including functions, settings, policies, and procedures.  Patient's individual exercise prescription and treatment plan were reviewed.  All starting workloads were established based on the results of the 6 minute walk test done at initial orientation visit.  The plan for exercise progression was also introduced and progression will be customized based on patient's performance and goals.  Trevontae called to let us know he had a cath last week and now has swelling/hematoma in groin. Md has checked this and is watching it.  Dorrian will return when able to exercise.  Jyair returned today from cath.  He is still a little sore and walking on treadmill today.  He is going to try to do as much of his workloads as possible.        Exercise Goals and Review: Exercise Goals    Row Name 12/14/18 1620             Exercise Goals   Increase Physical Activity  Yes       Intervention  Provide advice, education, support and counseling about physical activity/exercise needs.;Develop an individualized exercise prescription for aerobic and resistive training based on initial evaluation findings, risk stratification, comorbidities and participant's personal goals.       Expected Outcomes  Short Term: Attend rehab on a regular basis to increase amount of physical activity.;Long Term: Add in home exercise to make exercise part of routine and to increase amount of physical activity.;Long Term: Exercising regularly at least 3-5 days a week.       Increase Strength and Stamina  Yes       Intervention  Provide advice, education, support and counseling about physical activity/exercise needs.;Develop an individualized exercise prescription for aerobic and resistive training based on initial evaluation findings, risk stratification, comorbidities and participant's personal goals.       Expected Outcomes  Short Term: Increase workloads from initial exercise  prescription for resistance, speed, and METs.;Short Term: Perform resistance training exercises routinely during rehab and add in resistance training at home;Long Term: Improve cardiorespiratory  fitness, muscular endurance and strength as measured by increased METs and functional capacity (6MWT)       Able to understand and use rate of perceived exertion (RPE) scale  Yes       Intervention  Provide education and explanation on how to use RPE scale       Expected Outcomes  Short Term: Able to use RPE daily in rehab to express subjective intensity level;Long Term:  Able to use RPE to guide intensity level when exercising independently       Knowledge and understanding of Target Heart Rate Range (THRR)  Yes       Intervention  Provide education and explanation of THRR including how the numbers were predicted and where they are located for reference       Expected Outcomes  Short Term: Able to state/look up THRR;Short Term: Able to use daily as guideline for intensity in rehab;Long Term: Able to use THRR to govern intensity when exercising independently       Able to check pulse independently  Yes       Intervention  Provide education and demonstration on how to check pulse in carotid and radial arteries.;Review the importance of being able to check your own pulse for safety during independent exercise       Expected Outcomes  Short Term: Able to explain why pulse checking is important during independent exercise;Long Term: Able to check pulse independently and accurately       Understanding of Exercise Prescription  Yes       Intervention  Provide education, explanation, and written materials on patient's individual exercise prescription       Expected Outcomes  Short Term: Able to explain program exercise prescription;Long Term: Able to explain home exercise prescription to exercise independently          Exercise Goals Re-Evaluation : Exercise Goals Re-Evaluation    Row Name 12/16/18 0740 12/30/18  1305 01/01/19 0820 01/04/19 0752 01/12/19 1543     Exercise Goal Re-Evaluation   Exercise Goals Review  Increase Physical Activity;Increase Strength and Stamina;Able to understand and use rate of perceived exertion (RPE) scale;Knowledge and understanding of Target Heart Rate Range (THRR);Understanding of Exercise Prescription  Increase Physical Activity;Increase Strength and Stamina;Able to understand and use rate of perceived exertion (RPE) scale;Knowledge and understanding of Target Heart Rate Range (THRR);Understanding of Exercise Prescription;Able to check pulse independently  Increase Physical Activity;Increase Strength and Stamina;Able to understand and use rate of perceived exertion (RPE) scale;Knowledge and understanding of Target Heart Rate Range (THRR);Able to check pulse independently;Understanding of Exercise Prescription  Increase Physical Activity;Increase Strength and Stamina;Understanding of Exercise Prescription  Increase Physical Activity;Increase Strength and Stamina;Understanding of Exercise Prescription   Comments  Reviewed RPE scale, THR and program prescription with pt today.  Pt voiced understanding and was given a copy of goals to take home.  Leiby has added intervals to hiw work.  He works above 6 METS during intervals.  Staff will monitor progress.  Reviewed home exercise with pt today.  Pt plans to join a gym for exercise.  Reviewed THR, pulse, RPE, sign and symptoms, NTG use, and when to call 911 or MD.  Also discussed weather considerations and indoor options.  Pt voiced understanding.  Bradshaw is doing well in rehab.  He is already walking at home on his off days.  He is using 5 lbs weights at home three times a week.  He feels that his strength and stamina are starting to recover.  Aadyn continues to do well in rehab.  Last week he had a transplant work up with a cath that left him with a hematoma, thus he is out this week.  He had worked up to level 5 on the NuStep.  We will  continue to monitor his progress upon return.   Expected Outcomes  Short: Use RPE daily to regulate intensity. Long: Follow program prescription in THR.  Short - continue to build stamina with intervals Long - improve overall MET level  Short - add 1-2 days of cardio outside program sessions Long - maintain exercise on his own  Short: Continue to walk at home. Long: Continue to improve strength and stamina.  Short: Return to rehab.  Long: Continue to use intervals.      Discharge Exercise Prescription (Final Exercise Prescription Changes): Exercise Prescription Changes - 01/12/19 1500      Response to Exercise   Blood Pressure (Admit)  120/80    Blood Pressure (Exercise)  142/82    Blood Pressure (Exit)  132/82    Heart Rate (Admit)  96 bpm    Heart Rate (Exercise)  124 bpm    Heart Rate (Exit)  99 bpm    Rating of Perceived Exertion (Exercise)  12    Symptoms  none    Duration  Progress to 30 minutes of  aerobic without signs/symptoms of physical distress    Intensity  THRR unchanged      Progression   Progression  Continue to progress workloads to maintain intensity without signs/symptoms of physical distress.    Average METs  5.97      Resistance Training   Training Prescription  Yes    Weight  6 lbs    Reps  10-15      Interval Training   Interval Training  Yes    Equipment  NuStep;Recumbant Elliptical    Comments  2 min off 1 min on      Treadmill   MPH  3    Grade  8.5    Minutes  15    METs  6.5      NuStep   Level  4    Minutes  15    METs  7.3      REL-XR   Level  4    Minutes  15    METs  3.8      Home Exercise Plan   Plans to continue exercise at  Home (comment)   walking, join a gym   Frequency  Add 2 additional days to program exercise sessions.    Initial Home Exercises Provided  12/30/18       Nutrition:  Target Goals: Understanding of nutrition guidelines, daily intake of sodium '1500mg'$ , cholesterol '200mg'$ , calories 30% from fat and 7% or  less from saturated fats, daily to have 5 or more servings of fruits and vegetables.  Biometrics: Pre Biometrics - 12/14/18 1620      Pre Biometrics   Height  5' 11.25" (1.81 m)    Weight  229 lb 6.4 oz (104.1 kg)    BMI (Calculated)  31.76        Nutrition Therapy Plan and Nutrition Goals: Nutrition Therapy & Goals - 12/14/18 1652      Nutrition Therapy   Diet  Low Na HH diet    Drug/Food Interactions  Coumadin/Vit K    Protein (specify units)  84g    Fiber  30 grams    Whole Grain Foods  3 servings    Saturated Fats  12 max. grams    Fruits and Vegetables  5 servings/day    Sodium  1.5 grams      Personal Nutrition Goals   Nutrition Goal  ST: lower Na (change B and L - oatmeal and sandwhich respectively) LT: be able to be there for his kids, increase functionality and functional life.    Comments  Pt is very motivated; encouraged small changes that build up over time. Pt on fluid pill but doesnt check swelling or weight, stressed the importance of that. Pt reports that fluid recommendations by doctors is 2L. Pt reports wanting to reduce Na first due to breathing and fluid; told pt that it was important but also a piece to the puzzle as there are other factors in his diet such as eating healthy fat, fiber, healthy CHOs. Discussed fiber needs and how to increase, discussed whole grain foods, HH eating and low Na eating told pt 1500-'2000mg'$  is a good range to stay in and '2000mg'$  may be more realistic; also discussed importance of getting enough Na - don't want to go too low in Na. Discussed set point and the importance of healthy outcomes and eating enough for his heart (try not to restrict calories past needs). Pt was taking notes and seemed to be ready for changes; some language regarding diet and all or nothing language was noticed. Discussed again small changes and sustainability - this is for long term. Pt reports eating out 3 meals per day (bacon bisuit for B, chick-fil-a for L, and  take out for dinner). Pt reports likeing mustanrd and fish now where before he didnt.      Intervention Plan   Intervention  Prescribe, educate and counsel regarding individualized specific dietary modifications aiming towards targeted core components such as weight, hypertension, lipid management, diabetes, heart failure and other comorbidities.;Nutrition handout(s) given to patient.    Expected Outcomes  Short Term Goal: Understand basic principles of dietary content, such as calories, fat, sodium, cholesterol and nutrients.;Short Term Goal: A plan has been developed with personal nutrition goals set during dietitian appointment.;Long Term Goal: Adherence to prescribed nutrition plan.       Nutrition Assessments: Nutrition Assessments - 12/07/18 1718      MEDFICTS Scores   Pre Score  125       Nutrition Goals Re-Evaluation:   Nutrition Goals Discharge (Final Nutrition Goals Re-Evaluation):   Psychosocial: Target Goals: Acknowledge presence or absence of significant depression and/or stress, maximize coping skills, provide positive support system. Participant is able to verbalize types and ability to use techniques and skills needed for reducing stress and depression.   Initial Review & Psychosocial Screening: Initial Psych Review & Screening - 12/07/18 1244      Initial Review   Current issues with  Current Sleep Concerns   Not sleeping as well in past 2 months.  Waking up frequently.     Family Dynamics   Good Support System?  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      Screening Interventions   Interventions  Encouraged to exercise;To provide support and resources with identified psychosocial needs;Provide feedback about the scores to participant     Expected Outcomes  Short Term goal: Utilizing psychosocial counselor,  staff and physician to assist with identification of specific Stressors or current issues interfering with healing process. Setting desired goal for each stressor or current issue identified.;Long Term Goal: Stressors or current issues are controlled or eliminated.;Short Term goal: Identification and review with participant of any Quality of Life or Depression concerns found by scoring the questionnaire.;Long Term goal: The participant improves quality of Life and PHQ9 Scores as seen by post scores and/or verbalization of changes       Quality of Life Scores:  Quality of Life - 12/07/18 1716      Quality of Life   Select  Quality of Life      Quality of Life Scores   Health/Function Pre  15.8 %    Socioeconomic Pre  18.56 %    Psych/Spiritual Pre  17.07 %    Family Pre  27.6 %    GLOBAL Pre  18.37 %      Scores of 19 and below usually indicate a poorer quality of life in these areas.  A difference of  2-3 points is a clinically meaningful difference.  A difference of 2-3 points in the total score of the Quality of Life Index has been associated with significant improvement in overall quality of life, self-image, physical symptoms, and general health in studies assessing change in quality of life.  PHQ-9: Recent Review Flowsheet Data    Depression screen [facility] 2/9 01/15/2019 12/14/2018   Decreased Interest 0 2   Down, Depressed, Hopeless 0 1   PHQ - 2 Score 0 3   Altered sleeping 0 2   Tired, decreased energy 1 2   Change in appetite 0 1   Feeling bad or failure about yourself  0 3   Trouble concentrating 0 1   Moving slowly or fidgety/restless 0 0   Suicidal thoughts 0 0   PHQ-9 Score 1 12   Difficult doing work/chores Not difficult at all Somewhat difficult     Interpretation of Total Score  Total Score Depression Severity:  1-4 = Minimal depression, 5-9 = Mild depression, 10-14 = Moderate depression, 15-19 =  Moderately severe depression, 20-27 = Severe depression   Psychosocial Evaluation and Intervention: Psychosocial Evaluation - 12/07/18 1712      Psychosocial Evaluation & Interventions   Interventions  Relaxation education;Stress management education;Encouraged to exercise with the program and follow exercise prescription    Comments  Mosi has no barriers to the program. He does state that he has some sleep concerns. Waking up to go to the bathroom and not falling asleep immediately, occuring in the past few months. 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 Will review QOL scores with Bolivar during his next visit. Vineet feels deconditioned and hopes to see improvement in his exercise capacity..    Expected Outcomes  STG: Breandan will see improvement in energy levels and hopefully sleep pattern.  :LTG: continued improvements and higher QOL scores at discharge    Continue Psychosocial Services   Follow up required by staff       Psychosocial Re-Evaluation: Psychosocial Re-Evaluation    [place] Name 01/04/19 0753 01/15/19 0740           Psychosocial Re-Evaluation   Current issues with  Current Sleep Concerns;Current Stress Concerns  Current Stress Concerns      Comments  Omar is doing well in rehab.  He is working on weight loss and  gets frustrated when he gains a little weight but it usually comes back off. He is sleeping better and he has not been using the medicine and feeling good!!  Overall he is doing good.  He has found that the schedule and accountabilty of being in class has been very helpful.  Reviewed patient health questionnaire (PHQ-9) with patient for follow up. Previously, patients score indicated signs/symptoms of depression.  Reviewed to see if patient is improving symptom wise while in program.  Score improved and patient states that it is because they have been able to do more since starting  exercise.  Right now, he is hurting from the hemotoma from his cath last week.      Expected Outcomes  Short: Continue to attend class regularly.  Long: Continue to get better sleep.  Short: Continue to attend class regularly.  Long: Continue to get better sleep.      Interventions  Encouraged to attend Cardiac Rehabilitation for the exercise  Encouraged to attend Cardiac Rehabilitation for the exercise      Continue Psychosocial Services   Follow up required by staff  -         Psychosocial Discharge (Final Psychosocial Re-Evaluation): Psychosocial Re-Evaluation - 01/15/19 0740      Psychosocial Re-Evaluation   Current issues with  Current Stress Concerns    Comments  Reviewed patient health questionnaire (PHQ-9) with patient for follow up. Previously, patients score indicated signs/symptoms of depression.  Reviewed to see if patient is improving symptom wise while in program.  Score improved and patient states that it is because they have been able to do more since starting exercise.  Right now, he is hurting from the hemotoma from his cath last week.    Expected Outcomes  Short: Continue to attend class regularly.  Long: Continue to get better sleep.    Interventions  Encouraged to attend Cardiac Rehabilitation for the exercise       Vocational Rehabilitation: Provide vocational rehab assistance to qualifying candidates.   Vocational Rehab Evaluation & Intervention: Vocational Rehab - 12/07/18 1248      Initial Vocational Rehab Evaluation & Intervention   Assessment shows need for Vocational Rehabilitation  No       Education: Education Goals: Education classes will be provided on a variety of topics geared toward better understanding of heart health and risk factor modification. Participant will state understanding/return demonstration of topics presented as noted by education test scores.  Learning Barriers/Preferences: Learning Barriers/Preferences - 12/07/18 1248       Learning Barriers/Preferences   Learning Barriers  None    Learning Preferences  None       Education Topics:  AED/CPR: - Group verbal and written instruction with the use of models to demonstrate the basic use of the AED with the basic ABC's of resuscitation.   General Nutrition Guidelines/Fats and Fiber: -Group instruction provided by verbal, written material, models and posters to present the general guidelines for heart healthy nutrition. Gives an explanation and review of dietary fats and fiber.   Controlling Sodium/Reading Food Labels: -Group verbal and written material supporting the discussion of sodium use in heart healthy nutrition. Review and explanation with models, verbal and written materials for utilization of the food label.   Exercise Physiology & General Exercise Guidelines: - Group verbal and written instruction with models to review the exercise physiology of the cardiovascular system and associated critical values. Provides general exercise guidelines with specific guidelines to those with heart or lung disease.  Aerobic Exercise & Resistance Training: - Gives group verbal and written instruction on the various components of exercise. Focuses on aerobic and resistive training programs and the benefits of this training and how to safely progress through these programs..   Flexibility, Balance, Mind/Body Relaxation: Provides group verbal/written instruction on the benefits of flexibility and balance training, including mind/body exercise modes such as yoga, pilates and tai chi.  Demonstration and skill practice provided.   Stress and Anxiety: - Provides group verbal and written instruction about the health risks of elevated stress and causes of high stress.  Discuss the correlation between heart/lung disease and anxiety and treatment options. Review healthy ways to manage with stress and anxiety.   Depression: - Provides group verbal and written instruction  on the correlation between heart/lung disease and depressed mood, treatment options, and the stigmas associated with seeking treatment.   Anatomy & Physiology of the Heart: - Group verbal and written instruction and models provide basic cardiac anatomy and physiology, with the coronary electrical and arterial systems. Review of Valvular disease and Heart Failure   Cardiac Procedures: - Group verbal and written instruction to review commonly prescribed medications for heart disease. Reviews the medication, class of the drug, and side effects. Includes the steps to properly store meds and maintain the prescription regimen. (beta blockers and nitrates)   Cardiac Medications I: - Group verbal and written instruction to review commonly prescribed medications for heart disease. Reviews the medication, class of the drug, and side effects. Includes the steps to properly store meds and maintain the prescription regimen.   Cardiac Medications II: -Group verbal and written instruction to review commonly prescribed medications for heart disease. Reviews the medication, class of the drug, and side effects. (all other drug classes)    Go Sex-Intimacy & Heart Disease, Get SMART - Goal Setting: - Group verbal and written instruction through game format to discuss heart disease and the return to sexual intimacy. Provides group verbal and written material to discuss and apply goal setting through the application of the S.M.A.R.T. Method.   Other Matters of the Heart: - Provides group verbal, written materials and models to describe Stable Angina and Peripheral Artery. Includes description of the disease process and treatment options available to the cardiac patient.   Exercise & Equipment Safety: - Individual verbal instruction and demonstration of equipment use and safety with use of the equipment.   Cardiac Rehab from 12/14/2018 in [facility] Cardiac and Pulmonary Rehab  Date  12/14/18  Educator  AS   Instruction Review Code  1- Verbalizes Understanding      Infection Prevention: - Provides verbal and written material to individual with discussion of infection control including proper hand washing and proper equipment cleaning during exercise session.   Cardiac Rehab from 12/14/2018 in [facility] Cardiac and Pulmonary Rehab  Date  12/14/18  Educator  AS  Instruction Review Code  1- Verbalizes Understanding      Falls Prevention: - Provides verbal and written material to individual with discussion of falls prevention and safety.   Diabetes: - Individual verbal and written instruction to review signs/symptoms of diabetes, desired ranges of glucose level fasting, after meals and with exercise. Acknowledge that pre and post exercise glucose checks will be done for 3 sessions at entry of program.   Know Your Numbers and Risk Factors: -Group verbal and written instruction about important numbers in your health.  Discussion of what are risk factors and how they play a role in the disease process.  Review of Cholesterol, Blood Pressure, Diabetes, and BMI and the role they play in your overall health.   Sleep Hygiene: -Provides group verbal and written instruction about how sleep can affect your health.  Define sleep hygiene, discuss sleep cycles and impact of sleep habits. Review good sleep hygiene tips.    Other: -Provides group and verbal instruction on various topics (see comments)   Knowledge Questionnaire Score: Knowledge Questionnaire Score - 12/07/18 1718      Knowledge Questionnaire Score   Pre Score  23/26  MIssed angina, nutrition and exercise questions       Core Components/Risk Factors/Patient Goals at Admission: Personal Goals and Risk Factors at Admission - 12/14/18 1622      Core Components/Risk Factors/Patient Goals on Admission    Weight Management  Weight Loss;Obesity    Intervention  Weight Management/Obesity: Establish reasonable short term and long term weight  goals.    Admit Weight  229 lb 6.4 oz (104.1 kg)    Goal Weight: Long Term  200 lb (90.7 kg)    Expected Outcomes  Short Term: Continue to assess and modify interventions until short term weight is achieved;Long Term: Adherence to nutrition and physical activity/exercise program aimed toward attainment of established weight goal;Weight Loss: Understanding of general recommendations for a balanced deficit meal plan, which promotes 1-2 lb weight loss per week and includes a negative energy balance of [phone] kcal/d;Weight Maintenance: Understanding of the daily nutrition guidelines, which includes 25-35% calories from fat, 7% or less cal from saturated fats, less than '200mg'$  cholesterol, less than 1.5gm of sodium, & 5 or more servings of fruits and vegetables daily;Understanding recommendations for meals to include 15-35% energy as protein, 25-35% energy from fat, 35-60% energy from carbohydrates, less than '200mg'$  of dietary cholesterol, 20-35 gm of total fiber daily;Understanding of distribution of calorie intake throughout the day with the consumption of 4-5 meals/snacks;Weight Gain: Understanding of general recommendations for a high calorie, high protein meal plan that promotes weight gain by distributing calorie intake throughout the day with the consumption for 4-5 meals, snacks, and/or supplements    Intervention  Provide education and support for participant on nutrition & aerobic/resistive exercise along with prescribed medications to achieve LDL '70mg'$ , HDL >'40mg'$ .    Expected Outcomes  Short Term: Participant states understanding of desired cholesterol values and is compliant with medications prescribed. Participant is following exercise prescription and nutrition guidelines.;Long Term: Cholesterol controlled with medications as prescribed, with individualized exercise RX and with personalized nutrition plan. Value goals: LDL < '70mg'$ , HDL > 40 mg.       Core Components/Risk Factors/Patient Goals  Review:  Goals and Risk Factor Review    Row Name 01/04/19 0755             Core Components/Risk Factors/Patient Goals Review   Personal Goals Review  Weight Management/Obesity;Lipids       Review  Mariusz is doing well in rehab. His weight is coming down; today he was 220lb!!  He continues to work on this with exercise and diet.  He is doing well with his medications.       Expected Outcomes  Short: Continue to work on weight loss.  Long: Continue to montior risk factors.          Core Components/Risk Factors/Patient Goals at Discharge (Final Review):  Goals and Risk Factor Review - 01/04/19 0755      Core Components/Risk Factors/Patient Goals Review   Personal Goals Review  Weight Management/Obesity;Lipids    Review  Viren is doing well in rehab. His weight is coming down; today he was 220lb!!  He continues to work on this with exercise and diet.  He is doing well with his medications.    Expected Outcomes  Short: Continue to work on weight loss.  Long: Continue to montior risk factors.       ITP Comments: ITP Comments    Row Name 12/07/18 1432 12/14/18 1614 12/30/18 0553 01/11/19 1253 01/27/19 1041   ITP Comments  Virtual Orientation completed   Appt made for 8/24 to complete EP/RD Evals and gym orientation  Completed initital 6MWT and nutrition eval.  ITP created  and sent for review  30 Day review. Continue with ITP unless directed changes per Medical Director review.   New to program  Arben called to let us know he had a cath last week and now has swelling/hematoma in groin. Md has checked this and is watching it.  Daejon will return when able to exercise.  30 day review completed. ITP sent to Dr. Emily Filbert, Medical Director of Cardiac and Pulmonary Rehab. Continue with ITP unless changes are made by physician.  Department closed starting 10/2 until further notice by infection prevention and Health at Work teams for [place].      Comments: 30 day review

## 2019-02-01 ENCOUNTER — Encounter: Payer: BC Managed Care – PPO | Admitting: *Deleted

## 2019-02-01 ENCOUNTER — Other Ambulatory Visit: Payer: Self-pay

## 2019-02-01 DIAGNOSIS — Z7952 Long term (current) use of systemic steroids: Secondary | ICD-10-CM | POA: Diagnosis not present

## 2019-02-01 DIAGNOSIS — Z941 Heart transplant status: Secondary | ICD-10-CM | POA: Diagnosis not present

## 2019-02-01 DIAGNOSIS — Z7982 Long term (current) use of aspirin: Secondary | ICD-10-CM | POA: Diagnosis not present

## 2019-02-01 DIAGNOSIS — Z79899 Other long term (current) drug therapy: Secondary | ICD-10-CM | POA: Diagnosis not present

## 2019-02-01 DIAGNOSIS — Z7901 Long term (current) use of anticoagulants: Secondary | ICD-10-CM | POA: Diagnosis not present

## 2019-02-01 NOTE — Progress Notes (Signed)
Daily Session Note  Patient Details  Name: Macalister Arnaud MRN: [id number] Date of Birth: 1976-01-31 Referring Provider:     Cardiac Rehab from 12/14/2018 in [facility] Cardiac and Pulmonary Rehab  Referring Provider  McGarrah      Encounter Date: 02/01/2019  Check In: Session Check In - 02/01/19 0745      Check-In   Supervising physician immediately available to respond to emergencies  See telemetry face sheet for immediately available ER MD    Location  [facility]-Cardiac & Pulmonary Rehab    Staff Present  Heath Lark, RN, BSN, CCRP;Jeanna Durrell BS, Exercise Physiologist;Jessica [place], MA, RCEP, CCRP, CCET    Virtual Visit  No    Medication changes reported      No    Fall or balance concerns reported     No    Warm-up and Cool-down  Performed on first and last piece of equipment    Resistance Training Performed  Yes    VAD Patient?  No    PAD/SET Patient?  No      Pain Assessment   Currently in Pain?  No/denies          Social History   Tobacco Use  Smoking Status Never Smoker  Smokeless Tobacco Never Used    Goals Met:  Independence with exercise equipment Exercise tolerated well No report of cardiac concerns or symptoms  Goals Unmet:  Not Applicable  Comments: Pt able to follow exercise prescription today without complaint.  Will continue to monitor for progression.    Dr. Emily Filbert is Medical Director for [place] and [facility].

## 2019-02-03 ENCOUNTER — Other Ambulatory Visit: Payer: Self-pay

## 2019-02-03 ENCOUNTER — Encounter: Payer: BC Managed Care – PPO | Admitting: *Deleted

## 2019-02-03 DIAGNOSIS — Z941 Heart transplant status: Secondary | ICD-10-CM | POA: Diagnosis not present

## 2019-02-03 NOTE — Progress Notes (Signed)
Daily Session Note  Patient Details  Name: Nicholas Weaver MRN: [id number] Date of Birth: [birth year] Referring Provider:     Cardiac Rehab from 12/14/2018 in [facility] Cardiac and Pulmonary Rehab  Referring Provider  McGarrah      Encounter Date: 02/03/2019  Check In: Session Check In - 02/03/19 0742      Check-In   Supervising physician immediately available to respond to emergencies  See telemetry face sheet for immediately available ER MD    Location  [facility]-Cardiac & Pulmonary Rehab    Staff Present  Gerlene Burdock, RN, BSN-BC, CCRP;Aulden Calise [place], MA, RCEP, CCRP, CCET;Joseph Hood RCP,RRT,BSRT    Virtual Visit  No    Medication changes reported      No    Fall or balance concerns reported     No    Warm-up and Cool-down  Performed on first and last piece of equipment    Resistance Training Performed  Yes    VAD Patient?  No    PAD/SET Patient?  No      Pain Assessment   Currently in Pain?  No/denies          Social History   Tobacco Use  Smoking Status Never Smoker  Smokeless Tobacco Never Used    Goals Met:  Independence with exercise equipment Exercise tolerated well Personal goals reviewed No report of cardiac concerns or symptoms Strength training completed today  Goals Unmet:  Not Applicable  Comments: Pt able to follow exercise prescription today without complaint.  Will continue to monitor for progression.    Dr. Emily Filbert is Medical Director for [place] and [facility].

## 2019-02-08 ENCOUNTER — Other Ambulatory Visit: Payer: Self-pay

## 2019-02-08 ENCOUNTER — Encounter: Payer: BC Managed Care – PPO | Admitting: *Deleted

## 2019-02-08 DIAGNOSIS — Z941 Heart transplant status: Secondary | ICD-10-CM | POA: Diagnosis not present

## 2019-02-08 NOTE — Progress Notes (Signed)
Daily Session Note  Patient Details  Name: Yerachmiel Spinney MRN: [id number] Date of Birth: 1976/02/06 Referring Provider:     Cardiac Rehab from 12/14/2018 in [facility] Cardiac and Pulmonary Rehab  Referring Provider  McGarrah      Encounter Date: 02/08/2019  Check In: Session Check In - 02/08/19 0733      Check-In   Supervising physician immediately available to respond to emergencies  See telemetry face sheet for immediately available ER MD    Location  [facility]-Cardiac & Pulmonary Rehab    Staff Present  Heath Lark, RN, BSN, Laveda Norman, BS, ACSM CEP, Exercise Physiologist    Virtual Visit  No    Medication changes reported      No    Fall or balance concerns reported     No    Warm-up and Cool-down  Performed on first and last piece of equipment    Resistance Training Performed  Yes    VAD Patient?  No    PAD/SET Patient?  No      Pain Assessment   Currently in Pain?  No/denies          Social History   Tobacco Use  Smoking Status Never Smoker  Smokeless Tobacco Never Used    Goals Met:  Independence with exercise equipment Exercise tolerated well No report of cardiac concerns or symptoms  Goals Unmet:  Not Applicable  Comments: Pt able to follow exercise prescription today without complaint.  Will continue to monitor for progression.    Dr. Emily Filbert is Medical Director for [place] and [facility].

## 2019-02-12 ENCOUNTER — Encounter: Payer: BC Managed Care – PPO | Admitting: *Deleted

## 2019-02-12 ENCOUNTER — Other Ambulatory Visit: Payer: Self-pay

## 2019-02-12 DIAGNOSIS — Z941 Heart transplant status: Secondary | ICD-10-CM | POA: Diagnosis not present

## 2019-02-12 NOTE — Progress Notes (Signed)
Daily Session Note  Patient Details  Name: Nicholas Weaver MRN: [id number] Date of Birth: March 11, 1976 Referring Provider:     Cardiac Rehab from 12/14/2018 in [facility] Cardiac and Pulmonary Rehab  Referring Provider  McGarrah      Encounter Date: 02/12/2019  Check In: Session Check In - 02/12/19 0750      Check-In   Supervising physician immediately available to respond to emergencies  See telemetry face sheet for immediately available ER MD    Location  [facility]-Cardiac & Pulmonary Rehab    Staff Present  Heath Lark, RN, BSN, CCRP;Jessica [place], MA, RCEP, CCRP, CCET;Joseph Essex RCP,RRT,BSRT    Virtual Visit  No    Medication changes reported      No    Fall or balance concerns reported     No    Warm-up and Cool-down  Performed on first and last piece of equipment    Resistance Training Performed  Yes    VAD Patient?  No    PAD/SET Patient?  No      Pain Assessment   Currently in Pain?  No/denies          Social History   Tobacco Use  Smoking Status Never Smoker  Smokeless Tobacco Never Used    Goals Met:  Independence with exercise equipment Exercise tolerated well No report of cardiac concerns or symptoms  Goals Unmet:  Not Applicable  Comments: Pt able to follow exercise prescription today without complaint.  Will continue to monitor for progression.    Dr. Emily Filbert is Medical Director for [place] and [facility].

## 2019-02-15 ENCOUNTER — Encounter: Payer: BC Managed Care – PPO | Admitting: *Deleted

## 2019-02-15 ENCOUNTER — Other Ambulatory Visit: Payer: Self-pay

## 2019-02-15 DIAGNOSIS — Z941 Heart transplant status: Secondary | ICD-10-CM | POA: Diagnosis not present

## 2019-02-15 NOTE — Progress Notes (Signed)
Daily Session Note  Patient Details  Name: Nicholas Weaver MRN: [id number] Date of Birth: [birth year] Referring Provider:     Cardiac Rehab from 12/14/2018 in [facility] Cardiac and Pulmonary Rehab  Referring Provider  McGarrah      Encounter Date: 02/15/2019  Check In: Session Check In - 02/15/19 0733      Check-In   Supervising physician immediately available to respond to emergencies  See telemetry face sheet for immediately available ER MD    Location  [facility]-Cardiac & Pulmonary Rehab    Staff Present  Heath Lark, RN, BSN, Laveda Norman, BS, ACSM CEP, Exercise Physiologist    Virtual Visit  No    Medication changes reported      No    Fall or balance concerns reported     No    Warm-up and Cool-down  Performed on first and last piece of equipment    Resistance Training Performed  Yes    VAD Patient?  No    PAD/SET Patient?  No      Pain Assessment   Currently in Pain?  No/denies          Social History   Tobacco Use  Smoking Status Never Smoker  Smokeless Tobacco Never Used    Goals Met:  Independence with exercise equipment Exercise tolerated well No report of cardiac concerns or symptoms  Goals Unmet:  Not Applicable  Comments: Pt able to follow exercise prescription today without complaint.  Will continue to monitor for progression.    Dr. Emily Filbert is Medical Director for [place] and [facility].

## 2019-02-23 DIAGNOSIS — Z941 Heart transplant status: Secondary | ICD-10-CM

## 2019-02-24 ENCOUNTER — Encounter: Payer: Self-pay | Admitting: *Deleted

## 2019-02-24 DIAGNOSIS — Z941 Heart transplant status: Secondary | ICD-10-CM

## 2019-02-24 NOTE — Progress Notes (Signed)
Cardiac Individual Treatment Plan  Patient Details  Name: Nicholas Weaver MRN: [id number] Date of Birth: [birth year] Referring Provider:     Cardiac Rehab from 12/14/2018 in [facility] Cardiac and Pulmonary Rehab  Referring Provider  McGarrah      Initial Encounter Date:    Cardiac Rehab from 12/14/2018 in [facility] Cardiac and Pulmonary Rehab  Date  12/14/18      Visit Diagnosis: Status post heart transplant [facility])  Patient's Home Medications on Admission:  Current Outpatient Medications:  .  allopurinol (ZYLOPRIM) 100 MG tablet, Take by mouth., Disp: , Rfl:  .  amLODipine (NORVASC) 10 MG tablet, Take by mouth., Disp: , Rfl:  .  aspirin EC 81 MG tablet, Take by mouth., Disp: , Rfl:  .  calcium carbonate (CALTRATE 600) 1500 (600 Ca) MG TABS tablet, Take by mouth., Disp: , Rfl:  .  Evolocumab 140 MG/ML SOAJ, Inject into the skin., Disp: , Rfl:  .  metoprolol succinate (TOPROL-XL) 25 MG 24 hr tablet, Take by mouth., Disp: , Rfl:  .  mycophenolate (CELLCEPT) 500 MG tablet, Take by mouth., Disp: , Rfl:  .  pantoprazole (PROTONIX) 40 MG tablet, TAKE 1 TABLET BY MOUTH EVERY DAY, Disp: , Rfl:  .  predniSONE (DELTASONE) 2.5 MG tablet, TAKE 1 TABLET BY MOUTH ONCE DAILY TAKE 10 MG DAILY, THEN PER PREDNISONE TAPER AS DIRECTED., Disp: , Rfl:  .  tacrolimus (PROGRAF) 1 MG capsule, Take by mouth., Disp: , Rfl:  .  torsemide (DEMADEX) 20 MG tablet, Take by mouth., Disp: , Rfl:  .  warfarin (COUMADIN) 3 MG tablet, TAKE 1 TABLET BY MOUTH ONCE A DAY, Disp: , Rfl:   Past Medical History: No past medical history on file.  Tobacco Use: Social History   Tobacco Use  Smoking Status Never Smoker  Smokeless Tobacco Never Used    Labs: Recent Review Flowsheet Data    There is no flowsheet data to display.       Exercise Target Goals: Exercise Program Goal: Individual exercise prescription set using results from initial 6 min walk test and THRR while considering  patient's activity barriers and safety.    Exercise Prescription Goal: Initial exercise prescription builds to 30-45 minutes a day of aerobic activity, 2-3 days per week.  Home exercise guidelines will be given to patient during program as part of exercise prescription that the participant will acknowledge.  Activity Barriers & Risk Stratification: Activity Barriers & Cardiac Risk Stratification - 12/07/18 1243      Activity Barriers & Cardiac Risk Stratification   Activity Barriers  Deconditioning    Cardiac Risk Stratification  High       6 Minute Walk: 6 Minute Walk    Row Name 12/14/18 1615         6 Minute Walk   Distance  1645 feet     Walk Time  6 minutes     # of Rest Breaks  0     MPH  3.11     METS  5.17     RPE  11     Perceived Dyspnea   2     VO2 Peak  18.09     Symptoms  No     Resting HR  97 bpm     Resting BP  124/82     Resting Oxygen Saturation   96 %     Exercise Oxygen Saturation  during 6 min walk  96 %     Max Ex. HR  129 bpm     Max Ex. BP  150/84     2 Minute Post BP  124/78        Oxygen Initial Assessment:   Oxygen Re-Evaluation:   Oxygen Discharge (Final Oxygen Re-Evaluation):   Initial Exercise Prescription: Initial Exercise Prescription - 12/14/18 1600      Date of Initial Exercise RX and Referring Provider   Date  12/14/18    Referring Provider  McGarrah      Treadmill   MPH  3.1    Grade  1.5    Minutes  15    METs  4      NuStep   Level  4    SPM  80    Minutes  15    METs  4      REL-XR   Level  3    Speed  50    Minutes  15    METs  4      Prescription Details   Frequency (times per week)  3    Duration  Progress to 30 minutes of continuous aerobic without signs/symptoms of physical distress      Intensity   THRR 40-80% of Max Heartrate  128-160    Ratings of Perceived Exertion  11-15    Perceived Dyspnea  0-4      Resistance Training   Training Prescription  Yes    Weight  3 lb    Reps  10-15       Perform Capillary Blood Glucose  checks as needed.  Exercise Prescription Changes: Exercise Prescription Changes    Row Name 12/14/18 1600 12/30/18 1300 01/12/19 1500 02/02/19 1000 02/10/19 1400     Response to Exercise   Blood Pressure (Admit)  124/82  154/74  120/80  130/72  104/72   Blood Pressure (Exercise)  150/84  156/80  142/82  128/70  150/70   Blood Pressure (Exit)  124/78  134/70  132/82  128/52  102/64   Heart Rate (Admit)  97 bpm  86 bpm  96 bpm  93 bpm  83 bpm   Heart Rate (Exercise)  129 bpm  130 bpm  124 bpm  116 bpm  120 bpm   Heart Rate (Exit)  105 bpm  103 bpm  99 bpm  93 bpm  93 bpm   Oxygen Saturation (Admit)  96 %  -  -  -  -   Oxygen Saturation (Exercise)  96 %  -  -  -  -   Rating of Perceived Exertion (Exercise)  '11  14  12  15  13   '$ Perceived Dyspnea (Exercise)  2  -  -  -  -   Symptoms  none  none  none  none  none   Duration  -  -  Progress to 30 minutes of  aerobic without signs/symptoms of physical distress  Continue with 30 min of aerobic exercise without signs/symptoms of physical distress.  Continue with 30 min of aerobic exercise without signs/symptoms of physical distress.   Intensity  -  -  THRR unchanged  THRR unchanged  THRR unchanged     Progression   Progression  -  -  Continue to progress workloads to maintain intensity without signs/symptoms of physical distress.  Continue to progress workloads to maintain intensity without signs/symptoms of physical distress.  Continue to progress workloads to maintain intensity without signs/symptoms of physical distress.   Average METs  -  -  5.97  4.74  5.45     Resistance Training   Training Prescription  -  Yes  Yes  Yes  Yes   Weight  -  3 lb  6 lbs  6 lbs  6 lb   Reps  -  10-15  10-15  10-15  10-15     Interval Training   Interval Training  -  -  Yes  Yes  Yes   Equipment  -  -  NuStep;Recumbant Elliptical  NuStep;Recumbant Elliptical  Recumbant Bike;NuStep   Comments  -  -  2 min off 1 min on  2 min off 1 min on  2 min off 1 min on      Treadmill   MPH  -  '3  3  3  '$ -   Grade  -  1 to 8.5 intervals  8.5  1  -   Minutes  -  '15  15  15  '$ -   METs  -  3.7 6 + METS intervals  6.5  3.71  -     NuStep   Level  -  '4  4  5  '$ -   SPM  -  80  -  -  -   Minutes  -  '15  15  15  '$ -   METs  -  9.8  7.3  5  -     REL-XR   Level  -  -  4  4  -   Minutes  -  -  15  15  -   METs  -  -  3.8  5.5  -     Home Exercise Plan   Plans to continue exercise at  -  -  Home (comment) walking, join a gym  Home (comment) walking, join a gym  -   Frequency  -  -  Add 2 additional days to program exercise sessions.  Add 2 additional days to program exercise sessions.  -   Initial Home Exercises Provided  -  -  12/30/18  12/30/18  -      Exercise Comments: Exercise Comments    Row Name 12/16/18 1027 01/11/19 1253 01/15/19 0741       Exercise Comments  First full day of exercise!  Patient was oriented to gym and equipment including functions, settings, policies, and procedures.  Patient's individual exercise prescription and treatment plan were reviewed.  All starting workloads were established based on the results of the 6 minute walk test done at initial orientation visit.  The plan for exercise progression was also introduced and progression will be customized based on patient's performance and goals.  Nicholas Weaver called to let us know he had a cath last week and now has swelling/hematoma in groin. Md has checked this and is watching it.  Nicholas Weaver will return when able to exercise.  Nicholas Weaver returned today from cath.  He is still a little sore and walking on treadmill today.  He is going to try to do as much of his workloads as possible.        Exercise Goals and Review: Exercise Goals    Row Name 12/14/18 1620             Exercise Goals   Increase Physical Activity  Yes       Intervention  Provide advice, education, support and counseling about physical activity/exercise needs.;Develop an individualized exercise prescription for aerobic and  resistive training based on  initial evaluation findings, risk stratification, comorbidities and participant's personal goals.       Expected Outcomes  Short Term: Attend rehab on a regular basis to increase amount of physical activity.;Long Term: Add in home exercise to make exercise part of routine and to increase amount of physical activity.;Long Term: Exercising regularly at least 3-5 days a week.       Increase Strength and Stamina  Yes       Intervention  Provide advice, education, support and counseling about physical activity/exercise needs.;Develop an individualized exercise prescription for aerobic and resistive training based on initial evaluation findings, risk stratification, comorbidities and participant's personal goals.       Expected Outcomes  Short Term: Increase workloads from initial exercise prescription for resistance, speed, and METs.;Short Term: Perform resistance training exercises routinely during rehab and add in resistance training at home;Long Term: Improve cardiorespiratory fitness, muscular endurance and strength as measured by increased METs and functional capacity (6MWT)       Able to understand and use rate of perceived exertion (RPE) scale  Yes       Intervention  Provide education and explanation on how to use RPE scale       Expected Outcomes  Short Term: Able to use RPE daily in rehab to express subjective intensity level;Long Term:  Able to use RPE to guide intensity level when exercising independently       Knowledge and understanding of Target Heart Rate Range (THRR)  Yes       Intervention  Provide education and explanation of THRR including how the numbers were predicted and where they are located for reference       Expected Outcomes  Short Term: Able to state/look up THRR;Short Term: Able to use daily as guideline for intensity in rehab;Long Term: Able to use THRR to govern intensity when exercising independently       Able to check pulse independently  Yes        Intervention  Provide education and demonstration on how to check pulse in carotid and radial arteries.;Review the importance of being able to check your own pulse for safety during independent exercise       Expected Outcomes  Short Term: Able to explain why pulse checking is important during independent exercise;Long Term: Able to check pulse independently and accurately       Understanding of Exercise Prescription  Yes       Intervention  Provide education, explanation, and written materials on patient's individual exercise prescription       Expected Outcomes  Short Term: Able to explain program exercise prescription;Long Term: Able to explain home exercise prescription to exercise independently          Exercise Goals Re-Evaluation : Exercise Goals Re-Evaluation    Row Name 12/16/18 0740 12/30/18 1305 01/01/19 0820 01/04/19 0752 01/12/19 1543     Exercise Goal Re-Evaluation   Exercise Goals Review  Increase Physical Activity;Increase Strength and Stamina;Able to understand and use rate of perceived exertion (RPE) scale;Knowledge and understanding of Target Heart Rate Range (THRR);Understanding of Exercise Prescription  Increase Physical Activity;Increase Strength and Stamina;Able to understand and use rate of perceived exertion (RPE) scale;Knowledge and understanding of Target Heart Rate Range (THRR);Understanding of Exercise Prescription;Able to check pulse independently  Increase Physical Activity;Increase Strength and Stamina;Able to understand and use rate of perceived exertion (RPE) scale;Knowledge and understanding of Target Heart Rate Range (THRR);Able to check pulse independently;Understanding of Exercise Prescription  Increase Physical Activity;Increase Strength and  Stamina;Understanding of Exercise Prescription  Increase Physical Activity;Increase Strength and Stamina;Understanding of Exercise Prescription   Comments  Reviewed RPE scale, THR and program prescription with pt today.  Pt  voiced understanding and was given a copy of goals to take home.  Nicholas Weaver has added intervals to hiw work.  He works above 6 METS during intervals.  Staff will monitor progress.  Reviewed home exercise with pt today.  Pt plans to join a gym for exercise.  Reviewed THR, pulse, RPE, sign and symptoms, NTG use, and when to call 911 or MD.  Also discussed weather considerations and indoor options.  Pt voiced understanding.  Nicholas Weaver is doing well in rehab.  He is already walking at home on his off days.  He is using 5 lbs weights at home three times a week.  He feels that his strength and stamina are starting to recover.  Nicholas Weaver continues to do well in rehab.  Last week he had a transplant work up with a cath that left him with a hematoma, thus he is out this week.  He had worked up to level 5 on the NuStep.  We will continue to monitor his progress upon return.   Expected Outcomes  Short: Use RPE daily to regulate intensity. Long: Follow program prescription in THR.  Short - continue to build stamina with intervals Long - improve overall MET level  Short - add 1-2 days of cardio outside program sessions Long - maintain exercise on his own  Short: Continue to walk at home. Long: Continue to improve strength and stamina.  Short: Return to rehab.  Long: Continue to use intervals.   [place] Name 02/02/19 1017 02/03/19 0751 02/10/19 1431         Exercise Goal Re-Evaluation   Exercise Goals Review  Increase Physical Activity;Increase Strength and Stamina;Understanding of Exercise Prescription  Increase Physical Activity;Increase Strength and Stamina;Understanding of Exercise Prescription  Increase Physical Activity;Increase Strength and Stamina;Able to understand and use rate of perceived exertion (RPE) scale;Knowledge and understanding of Target Heart Rate Range (THRR);Able to check pulse independently;Understanding of Exercise Prescription     Comments  Nicholas Weaver is doing well in rehab.  He has been able to get back to his  full workloads again and is doing intervals.  He is up to 5.5 METs on the Recumbent Ellipitical.  We will continue to monitor his progress.  Nicholas Weaver has continued to do well in rehab.  He is exercising at home on his off days by walking and even on some of the days he comes to class.  He is regaining his strength and stamina.  Nicholas Weaver has improved overall MEt level to 5.45.  He is up to 6 lb for strength work.     Expected Outcomes  Short: Continue to add intervals and try again on treadmill. Long: Continue to improve stamina.  Short: Continue to add intervals and try again on treadmill. Long: Continue to improve stamina.  Short - continue to exercise consistently Long - improve overall MET level        Discharge Exercise Prescription (Final Exercise Prescription Changes): Exercise Prescription Changes - 02/10/19 1400      Response to Exercise   Blood Pressure (Admit)  104/72    Blood Pressure (Exercise)  150/70    Blood Pressure (Exit)  102/64    Heart Rate (Admit)  83 bpm    Heart Rate (Exercise)  120 bpm    Heart Rate (Exit)  93 bpm    Rating  of Perceived Exertion (Exercise)  13    Symptoms  none    Duration  Continue with 30 min of aerobic exercise without signs/symptoms of physical distress.    Intensity  THRR unchanged      Progression   Progression  Continue to progress workloads to maintain intensity without signs/symptoms of physical distress.    Average METs  5.45      Resistance Training   Training Prescription  Yes    Weight  6 lb    Reps  10-15      Interval Training   Interval Training  Yes    Equipment  Recumbant Bike;NuStep    Comments  2 min off 1 min on       Nutrition:  Target Goals: Understanding of nutrition guidelines, daily intake of sodium '1500mg'$ , cholesterol '200mg'$ , calories 30% from fat and 7% or less from saturated fats, daily to have 5 or more servings of fruits and vegetables.  Biometrics: Pre Biometrics - 12/14/18 1620      Pre Biometrics    Height  5' 11.25" (1.81 m)    Weight  229 lb 6.4 oz (104.1 kg)    BMI (Calculated)  31.76        Nutrition Therapy Plan and Nutrition Goals: Nutrition Therapy & Goals - 12/14/18 1652      Nutrition Therapy   Diet  Low Na HH diet    Drug/Food Interactions  Coumadin/Vit K    Protein (specify units)  84g    Fiber  30 grams    Whole Grain Foods  3 servings    Saturated Fats  12 max. grams    Fruits and Vegetables  5 servings/day    Sodium  1.5 grams      Personal Nutrition Goals   Nutrition Goal  ST: lower Na (change B and L - oatmeal and sandwhich respectively) LT: be able to be there for his kids, increase functionality and functional life.    Comments  Pt is very motivated; encouraged small changes that build up over time. Pt on fluid pill but doesnt check swelling or weight, stressed the importance of that. Pt reports that fluid recommendations by doctors is 2L. Pt reports wanting to reduce Na first due to breathing and fluid; told pt that it was important but also a piece to the puzzle as there are other factors in his diet such as eating healthy fat, fiber, healthy CHOs. Discussed fiber needs and how to increase, discussed whole grain foods, HH eating and low Na eating told pt 1500-'2000mg'$  is a good range to stay in and '2000mg'$  may be more realistic; also discussed importance of getting enough Na - don't want to go too low in Na. Discussed set point and the importance of healthy outcomes and eating enough for his heart (try not to restrict calories past needs). Pt was taking notes and seemed to be ready for changes; some language regarding diet and all or nothing language was noticed. Discussed again small changes and sustainability - this is for long term. Pt reports eating out 3 meals per day (bacon bisuit for B, chick-fil-a for L, and take out for dinner). Pt reports likeing mustanrd and fish now where before he didnt.      Intervention Plan   Intervention  Prescribe, educate and counsel  regarding individualized specific dietary modifications aiming towards targeted core components such as weight, hypertension, lipid management, diabetes, heart failure and other comorbidities.;Nutrition handout(s) given to patient.  Expected Outcomes  Short Term Goal: Understand basic principles of dietary content, such as calories, fat, sodium, cholesterol and nutrients.;Short Term Goal: A plan has been developed with personal nutrition goals set during dietitian appointment.;Long Term Goal: Adherence to prescribed nutrition plan.       Nutrition Assessments: Nutrition Assessments - 12/07/18 1718      MEDFICTS Scores   Pre Score  125       Nutrition Goals Re-Evaluation: Nutrition Goals Re-Evaluation    Row Name 02/23/19 1144             Goals   Nutrition Goal  ST: lower Na (change B and L - oatmeal and sandwhich respectively) LT: be able to be there for his kids, increase functionality and functional life.       Comment  Continue with current changes       Expected Outcome  ST: lower Na (change B and L - oatmeal and sandwhich respectively) LT: be able to be there for his kids, increase functionality and functional life.          Nutrition Goals Discharge (Final Nutrition Goals Re-Evaluation): Nutrition Goals Re-Evaluation - 02/23/19 1144      Goals   Nutrition Goal  ST: lower Na (change B and L - oatmeal and sandwhich respectively) LT: be able to be there for his kids, increase functionality and functional life.    Comment  Continue with current changes    Expected Outcome  ST: lower Na (change B and L - oatmeal and sandwhich respectively) LT: be able to be there for his kids, increase functionality and functional life.       Psychosocial: Target Goals: Acknowledge presence or absence of significant depression and/or stress, maximize coping skills, provide positive support system. Participant is able to verbalize types and ability to use techniques and skills needed for  reducing stress and depression.   Initial Review & Psychosocial Screening: Initial Psych Review & Screening - 12/07/18 1244      Initial Review   Current issues with  Current Sleep Concerns   Not sleeping as well in past 2 months.  Waking up frequently.     Family Dynamics   Good Support System?  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      Screening Interventions   Interventions  Encouraged to exercise;To provide support and resources with identified psychosocial needs;Provide feedback about the scores to participant    Expected Outcomes  Short Term goal: Utilizing psychosocial counselor, staff and physician to assist with identification of specific Stressors or current issues interfering with healing process. Setting desired goal for each stressor or current issue identified.;Long Term Goal: Stressors or current issues are controlled or eliminated.;Short Term goal: Identification and review with participant of any Quality of Life or Depression concerns found by scoring the questionnaire.;Long Term goal: The participant improves quality of Life and PHQ9 Scores as seen by post scores and/or verbalization of changes       Quality of Life Scores:  Quality of Life - 12/07/18 1716      Quality of Life   Select  Quality of Life      Quality of Life Scores   Health/Function Pre  15.8 %    Socioeconomic Pre  18.56 %  Psych/Spiritual Pre  17.07 %    Family Pre  27.6 %    GLOBAL Pre  18.37 %      Scores of 19 and below usually indicate a poorer quality of life in these areas.  A difference of  2-3 points is a clinically meaningful difference.  A difference of 2-3 points in the total score of the Quality of Life Index has been associated with significant improvement  in overall quality of life, self-image, physical symptoms, and general health in studies assessing change in quality of life.  PHQ-9: Recent Review Flowsheet Data    Depression screen [facility] 2/9 01/15/2019 12/14/2018   Decreased Interest 0 2   Down, Depressed, Hopeless 0 1   PHQ - 2 Score 0 3   Altered sleeping 0 2   Tired, decreased energy 1 2   Change in appetite 0 1   Feeling bad or failure about yourself  0 3   Trouble concentrating 0 1   Moving slowly or fidgety/restless 0 0   Suicidal thoughts 0 0   PHQ-9 Score 1 12   Difficult doing work/chores Not difficult at all Somewhat difficult     Interpretation of Total Score  Total Score Depression Severity:  1-4 = Minimal depression, 5-9 = Mild depression, 10-14 = Moderate depression, 15-19 = Moderately severe depression, 20-27 = Severe depression   Psychosocial Evaluation and Intervention: Psychosocial Evaluation - 12/07/18 1712      Psychosocial Evaluation & Interventions   Interventions  Relaxation education;Stress management education;Encouraged to exercise with the program and follow exercise prescription    Comments  Nicholas Weaver has no barriers to the program. He does state that he has some sleep concerns. Waking up to go to the bathroom and not falling asleep immediately, occuring in the past few months. 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 Will review QOL scores with Nicholas Weaver during his next visit. Nicholas Weaver feels deconditioned and hopes to see improvement in his exercise capacity..    Expected Outcomes  STG: Nicholas Weaver will see improvement in energy levels and hopefully sleep pattern.  :LTG: continued improvements and higher QOL scores at discharge    Continue Psychosocial Services   Follow up required by staff       Psychosocial Re-Evaluation: Psychosocial Re-Evaluation    [place] Name 01/04/19 0753 01/15/19 0740 02/03/19 0753         Psychosocial  Re-Evaluation   Current issues with  Current Sleep Concerns;Current Stress Concerns  Current Stress Concerns  Current Stress Concerns     Comments  Nicholas Weaver is doing well in rehab.  He is working on weight loss and gets frustrated when he gains a little weight but it usually comes back off. He is sleeping better and he has not been using the medicine and feeling good!!  Overall he is doing good.  He has found that the schedule and accountabilty of being in class has been very helpful.  Reviewed patient health questionnaire (PHQ-9) with patient for follow up. Previously, patients score indicated signs/symptoms of depression.  Reviewed to see if patient is improving symptom wise while in program.  Score improved and patient states that it is because they have been able to do more since starting exercise.  Right now, he is hurting from the hemotoma from his cath last week.  Nicholas Weaver is doing well mentally.  His weight is up some but better overall  just up vacation.  He is sleeping well.  Overall, staying positive and feeling good.     Expected Outcomes  Short: Continue to attend class regularly.  Long: Continue to get better sleep.  Short: Continue to attend class regularly.  Long: Continue to get better sleep.  Short: Continue to stay positive.  Long: Continue to practice self care.     Interventions  Encouraged to attend Cardiac Rehabilitation for the exercise  Encouraged to attend Cardiac Rehabilitation for the exercise  Encouraged to attend Cardiac Rehabilitation for the exercise     Continue Psychosocial Services   Follow up required by staff  -  Follow up required by staff        Psychosocial Discharge (Final Psychosocial Re-Evaluation): Psychosocial Re-Evaluation - 02/03/19 0753      Psychosocial Re-Evaluation   Current issues with  Current Stress Concerns    Comments  Nicholas Weaver is doing well mentally.  His weight is up some but better overall just up vacation.  He is sleeping well.  Overall, staying  positive and feeling good.    Expected Outcomes  Short: Continue to stay positive.  Long: Continue to practice self care.    Interventions  Encouraged to attend Cardiac Rehabilitation for the exercise    Continue Psychosocial Services   Follow up required by staff       Vocational Rehabilitation: Provide vocational rehab assistance to qualifying candidates.   Vocational Rehab Evaluation & Intervention: Vocational Rehab - 12/07/18 1248      Initial Vocational Rehab Evaluation & Intervention   Assessment shows need for Vocational Rehabilitation  No       Education: Education Goals: Education classes will be provided on a variety of topics geared toward better understanding of heart health and risk factor modification. Participant will state understanding/return demonstration of topics presented as noted by education test scores.  Learning Barriers/Preferences: Learning Barriers/Preferences - 12/07/18 1248      Learning Barriers/Preferences   Learning Barriers  None    Learning Preferences  None       Education Topics:  AED/CPR: - Group verbal and written instruction with the use of models to demonstrate the basic use of the AED with the basic ABC's of resuscitation.   General Nutrition Guidelines/Fats and Fiber: -Group instruction provided by verbal, written material, models and posters to present the general guidelines for heart healthy nutrition. Gives an explanation and review of dietary fats and fiber.   Controlling Sodium/Reading Food Labels: -Group verbal and written material supporting the discussion of sodium use in heart healthy nutrition. Review and explanation with models, verbal and written materials for utilization of the food label.   Exercise Physiology & General Exercise Guidelines: - Group verbal and written instruction with models to review the exercise physiology of the cardiovascular system and associated critical values. Provides general exercise  guidelines with specific guidelines to those with heart or lung disease.    Aerobic Exercise & Resistance Training: - Gives group verbal and written instruction on the various components of exercise. Focuses on aerobic and resistive training programs and the benefits of this training and how to safely progress through these programs..   Flexibility, Balance, Mind/Body Relaxation: Provides group verbal/written instruction on the benefits of flexibility and balance training, including mind/body exercise modes such as yoga, pilates and tai chi.  Demonstration and skill practice provided.   Stress and Anxiety: - Provides group verbal and written instruction about the health risks of elevated stress and causes of high stress.  Discuss the correlation between heart/lung disease and anxiety and treatment options. Review healthy ways to manage with stress and anxiety.   Depression: - Provides group verbal and written instruction on the correlation between heart/lung disease and depressed mood, treatment options, and the stigmas associated with seeking treatment.   Anatomy & Physiology of the Heart: - Group verbal and written instruction and models provide basic cardiac anatomy and physiology, with the coronary electrical and arterial systems. Review of Valvular disease and Heart Failure   Cardiac Procedures: - Group verbal and written instruction to review commonly prescribed medications for heart disease. Reviews the medication, class of the drug, and side effects. Includes the steps to properly store meds and maintain the prescription regimen. (beta blockers and nitrates)   Cardiac Medications I: - Group verbal and written instruction to review commonly prescribed medications for heart disease. Reviews the medication, class of the drug, and side effects. Includes the steps to properly store meds and maintain the prescription regimen.   Cardiac Medications II: -Group verbal and written  instruction to review commonly prescribed medications for heart disease. Reviews the medication, class of the drug, and side effects. (all other drug classes)    Go Sex-Intimacy & Heart Disease, Get SMART - Goal Setting: - Group verbal and written instruction through game format to discuss heart disease and the return to sexual intimacy. Provides group verbal and written material to discuss and apply goal setting through the application of the S.M.A.R.T. Method.   Other Matters of the Heart: - Provides group verbal, written materials and models to describe Stable Angina and Peripheral Artery. Includes description of the disease process and treatment options available to the cardiac patient.   Exercise & Equipment Safety: - Individual verbal instruction and demonstration of equipment use and safety with use of the equipment.   Cardiac Rehab from 12/14/2018 in [facility] Cardiac and Pulmonary Rehab  Date  12/14/18  Educator  AS  Instruction Review Code  1- Verbalizes Understanding      Infection Prevention: - Provides verbal and written material to individual with discussion of infection control including proper hand washing and proper equipment cleaning during exercise session.   Cardiac Rehab from 12/14/2018 in [facility] Cardiac and Pulmonary Rehab  Date  12/14/18  Educator  AS  Instruction Review Code  1- Verbalizes Understanding      Falls Prevention: - Provides verbal and written material to individual with discussion of falls prevention and safety.   Diabetes: - Individual verbal and written instruction to review signs/symptoms of diabetes, desired ranges of glucose level fasting, after meals and with exercise. Acknowledge that pre and post exercise glucose checks will be done for 3 sessions at entry of program.   Know Your Numbers and Risk Factors: -Group verbal and written instruction about important numbers in your health.  Discussion of what are risk factors and how they play a role  in the disease process.  Review of Cholesterol, Blood Pressure, Diabetes, and BMI and the role they play in your overall health.   Sleep Hygiene: -Provides group verbal and written instruction about how sleep can affect your health.  Define sleep hygiene, discuss sleep cycles and impact of sleep habits. Review good sleep hygiene tips.    Other: -Provides group and verbal instruction on various topics (see comments)   Knowledge Questionnaire Score: Knowledge Questionnaire Score - 12/07/18 1718      Knowledge Questionnaire Score   Pre Score  23/26  MIssed angina, nutrition and exercise questions  Core Components/Risk Factors/Patient Goals at Admission: Personal Goals and Risk Factors at Admission - 12/14/18 1622      Core Components/Risk Factors/Patient Goals on Admission    Weight Management  Weight Loss;Obesity    Intervention  Weight Management/Obesity: Establish reasonable short term and long term weight goals.    Admit Weight  229 lb 6.4 oz (104.1 kg)    Goal Weight: Long Term  200 lb (90.7 kg)    Expected Outcomes  Short Term: Continue to assess and modify interventions until short term weight is achieved;Long Term: Adherence to nutrition and physical activity/exercise program aimed toward attainment of established weight goal;Weight Loss: Understanding of general recommendations for a balanced deficit meal plan, which promotes 1-2 lb weight loss per week and includes a negative energy balance of [phone] kcal/d;Weight Maintenance: Understanding of the daily nutrition guidelines, which includes 25-35% calories from fat, 7% or less cal from saturated fats, less than '200mg'$  cholesterol, less than 1.5gm of sodium, & 5 or more servings of fruits and vegetables daily;Understanding recommendations for meals to include 15-35% energy as protein, 25-35% energy from fat, 35-60% energy from carbohydrates, less than '200mg'$  of dietary cholesterol, 20-35 gm of total fiber daily;Understanding of  distribution of calorie intake throughout the day with the consumption of 4-5 meals/snacks;Weight Gain: Understanding of general recommendations for a high calorie, high protein meal plan that promotes weight gain by distributing calorie intake throughout the day with the consumption for 4-5 meals, snacks, and/or supplements    Intervention  Provide education and support for participant on nutrition & aerobic/resistive exercise along with prescribed medications to achieve LDL '70mg'$ , HDL >'40mg'$ .    Expected Outcomes  Short Term: Participant states understanding of desired cholesterol values and is compliant with medications prescribed. Participant is following exercise prescription and nutrition guidelines.;Long Term: Cholesterol controlled with medications as prescribed, with individualized exercise RX and with personalized nutrition plan. Value goals: LDL < '70mg'$ , HDL > 40 mg.       Core Components/Risk Factors/Patient Goals Review:  Goals and Risk Factor Review    Row Name 01/04/19 0755 02/03/19 0755           Core Components/Risk Factors/Patient Goals Review   Personal Goals Review  Weight Management/Obesity;Lipids  Weight Management/Obesity;Lipids      Review  Nicholas Weaver is doing well in rehab. His weight is coming down; today he was 220lb!!  He continues to work on this with exercise and diet.  He is doing well with his medications.  Nicholas Weaver continues to do well in rehab.  His weight was up after a week off and being at the beach.  He is doing well with his blood pressures and medications.  Overall, he is good.      Expected Outcomes  Short: Continue to work on weight loss.  Long: Continue to montior risk factors.  Short: Continue to work on weight loss.  Long: Continue to montior risk factors.         Core Components/Risk Factors/Patient Goals at Discharge (Final Review):  Goals and Risk Factor Review - 02/03/19 0755      Core Components/Risk Factors/Patient Goals Review   Personal Goals  Review  Weight Management/Obesity;Lipids    Review  Nicholas Weaver continues to do well in rehab.  His weight was up after a week off and being at the beach.  He is doing well with his blood pressures and medications.  Overall, he is good.    Expected Outcomes  Short: Continue to work on [employer]  loss.  Long: Continue to montior risk factors.       ITP Comments: ITP Comments    Row Name 12/07/18 1432 12/14/18 1614 12/30/18 0553 01/11/19 1253 01/27/19 1041   ITP Comments  Virtual Orientation completed   Appt made for 8/24 to complete EP/RD Evals and gym orientation  Completed initital 6MWT and nutrition eval.  ITP created  and sent for review  30 Day review. Continue with ITP unless directed changes per Medical Director review.   New to program  Deval called to let us know he had a cath last week and now has swelling/hematoma in groin. Md has checked this and is watching it.  Jerrelle will return when able to exercise.  30 day review completed. ITP sent to Dr. Emily Filbert, Medical Director of Cardiac and Pulmonary Rehab. Continue with ITP unless changes are made by physician.  Department closed starting 10/2 until further notice by infection prevention and Health at Work teams for [place].   [place] Name 02/24/19 0618           ITP Comments  30 day review completed. Continue with ITP sent to Dr. Emily Filbert, Medical Director of Cardiac and Pulmonary Rehab for review , changes as needed and signature.          Comments:

## 2019-03-01 ENCOUNTER — Telehealth: Payer: Self-pay

## 2019-03-01 NOTE — Telephone Encounter (Signed)
Reached out to pt as he has been out since 10/26. Pt reports that he will be here Wednesday 03/03/19.

## 2019-03-03 ENCOUNTER — Encounter: Payer: Self-pay | Admitting: *Deleted

## 2019-03-03 DIAGNOSIS — Z941 Heart transplant status: Secondary | ICD-10-CM

## 2019-03-03 NOTE — Progress Notes (Signed)
Cardiac Individual Treatment Plan  Patient Details  Name: Nicholas Weaver MRN: [id number] Date of Birth: [birth year] Referring Provider:     Cardiac Rehab from 12/14/2018 in [facility] Cardiac and Pulmonary Rehab  Referring Provider  McGarrah      Initial Encounter Date:    Cardiac Rehab from 12/14/2018 in [facility] Cardiac and Pulmonary Rehab  Date  12/14/18      Visit Diagnosis: Status post heart transplant [facility])  Patient's Home Medications on Admission:  Current Outpatient Medications:  .  allopurinol (ZYLOPRIM) 100 MG tablet, Take by mouth., Disp: , Rfl:  .  amLODipine (NORVASC) 10 MG tablet, Take by mouth., Disp: , Rfl:  .  aspirin EC 81 MG tablet, Take by mouth., Disp: , Rfl:  .  calcium carbonate (CALTRATE 600) 1500 (600 Ca) MG TABS tablet, Take by mouth., Disp: , Rfl:  .  Evolocumab 140 MG/ML SOAJ, Inject into the skin., Disp: , Rfl:  .  metoprolol succinate (TOPROL-XL) 25 MG 24 hr tablet, Take by mouth., Disp: , Rfl:  .  mycophenolate (CELLCEPT) 500 MG tablet, Take by mouth., Disp: , Rfl:  .  pantoprazole (PROTONIX) 40 MG tablet, TAKE 1 TABLET BY MOUTH EVERY DAY, Disp: , Rfl:  .  predniSONE (DELTASONE) 2.5 MG tablet, TAKE 1 TABLET BY MOUTH ONCE DAILY TAKE 10 MG DAILY, THEN PER PREDNISONE TAPER AS DIRECTED., Disp: , Rfl:  .  tacrolimus (PROGRAF) 1 MG capsule, Take by mouth., Disp: , Rfl:  .  torsemide (DEMADEX) 20 MG tablet, Take by mouth., Disp: , Rfl:  .  warfarin (COUMADIN) 3 MG tablet, TAKE 1 TABLET BY MOUTH ONCE A DAY, Disp: , Rfl:   Past Medical History: No past medical history on file.  Tobacco Use: Social History   Tobacco Use  Smoking Status Never Smoker  Smokeless Tobacco Never Used    Labs: Recent Review Flowsheet Data    There is no flowsheet data to display.       Exercise Target Goals: Exercise Program Goal: Individual exercise prescription set using results from initial 6 min walk test and THRR while considering  patient's activity barriers and safety.    Exercise Prescription Goal: Initial exercise prescription builds to 30-45 minutes a day of aerobic activity, 2-3 days per week.  Home exercise guidelines will be given to patient during program as part of exercise prescription that the participant will acknowledge.  Activity Barriers & Risk Stratification: Activity Barriers & Cardiac Risk Stratification - 12/07/18 1243      Activity Barriers & Cardiac Risk Stratification   Activity Barriers  Deconditioning    Cardiac Risk Stratification  High       6 Minute Walk: 6 Minute Walk    Row Name 12/14/18 1615         6 Minute Walk   Distance  1645 feet     Walk Time  6 minutes     # of Rest Breaks  0     MPH  3.11     METS  5.17     RPE  11     Perceived Dyspnea   2     VO2 Peak  18.09     Symptoms  No     Resting HR  97 bpm     Resting BP  124/82     Resting Oxygen Saturation   96 %     Exercise Oxygen Saturation  during 6 min walk  96 %     Max Ex. HR  129 bpm     Max Ex. BP  150/84     2 Minute Post BP  124/78        Oxygen Initial Assessment:   Oxygen Re-Evaluation:   Oxygen Discharge (Final Oxygen Re-Evaluation):   Initial Exercise Prescription: Initial Exercise Prescription - 12/14/18 1600      Date of Initial Exercise RX and Referring Provider   Date  12/14/18    Referring Provider  McGarrah      Treadmill   MPH  3.1    Grade  1.5    Minutes  15    METs  4      NuStep   Level  4    SPM  80    Minutes  15    METs  4      REL-XR   Level  3    Speed  50    Minutes  15    METs  4      Prescription Details   Frequency (times per week)  3    Duration  Progress to 30 minutes of continuous aerobic without signs/symptoms of physical distress      Intensity   THRR 40-80% of Max Heartrate  128-160    Ratings of Perceived Exertion  11-15    Perceived Dyspnea  0-4      Resistance Training   Training Prescription  Yes    Weight  3 lb    Reps  10-15       Perform Capillary Blood Glucose  checks as needed.  Exercise Prescription Changes: Exercise Prescription Changes    Row Name 12/14/18 1600 12/30/18 1300 01/12/19 1500 02/02/19 1000 02/10/19 1400     Response to Exercise   Blood Pressure (Admit)  124/82  154/74  120/80  130/72  104/72   Blood Pressure (Exercise)  150/84  156/80  142/82  128/70  150/70   Blood Pressure (Exit)  124/78  134/70  132/82  128/52  102/64   Heart Rate (Admit)  97 bpm  86 bpm  96 bpm  93 bpm  83 bpm   Heart Rate (Exercise)  129 bpm  130 bpm  124 bpm  116 bpm  120 bpm   Heart Rate (Exit)  105 bpm  103 bpm  99 bpm  93 bpm  93 bpm   Oxygen Saturation (Admit)  96 %  -  -  -  -   Oxygen Saturation (Exercise)  96 %  -  -  -  -   Rating of Perceived Exertion (Exercise)  '11  14  12  15  13   '$ Perceived Dyspnea (Exercise)  2  -  -  -  -   Symptoms  none  none  none  none  none   Duration  -  -  Progress to 30 minutes of  aerobic without signs/symptoms of physical distress  Continue with 30 min of aerobic exercise without signs/symptoms of physical distress.  Continue with 30 min of aerobic exercise without signs/symptoms of physical distress.   Intensity  -  -  THRR unchanged  THRR unchanged  THRR unchanged     Progression   Progression  -  -  Continue to progress workloads to maintain intensity without signs/symptoms of physical distress.  Continue to progress workloads to maintain intensity without signs/symptoms of physical distress.  Continue to progress workloads to maintain intensity without signs/symptoms of physical distress.   Average METs  -  -  5.97  4.74  5.45     Resistance Training   Training Prescription  -  Yes  Yes  Yes  Yes   Weight  -  3 lb  6 lbs  6 lbs  6 lb   Reps  -  10-15  10-15  10-15  10-15     Interval Training   Interval Training  -  -  Yes  Yes  Yes   Equipment  -  -  NuStep;Recumbant Elliptical  NuStep;Recumbant Elliptical  Recumbant Bike;NuStep   Comments  -  -  2 min off 1 min on  2 min off 1 min on  2 min off 1 min on      Treadmill   MPH  -  '3  3  3  '$ -   Grade  -  1 to 8.5 intervals  8.5  1  -   Minutes  -  '15  15  15  '$ -   METs  -  3.7 6 + METS intervals  6.5  3.71  -     NuStep   Level  -  '4  4  5  '$ -   SPM  -  80  -  -  -   Minutes  -  '15  15  15  '$ -   METs  -  9.8  7.3  5  -     REL-XR   Level  -  -  4  4  -   Minutes  -  -  15  15  -   METs  -  -  3.8  5.5  -     Home Exercise Plan   Plans to continue exercise at  -  -  Home (comment) walking, join a gym  Home (comment) walking, join a gym  -   Frequency  -  -  Add 2 additional days to program exercise sessions.  Add 2 additional days to program exercise sessions.  -   Initial Home Exercises Provided  -  -  12/30/18  12/30/18  -   Nicholas Weaver Name 02/24/19 1200             Response to Exercise   Blood Pressure (Admit)  110/70       Blood Pressure (Exercise)  142/70       Blood Pressure (Exit)  120/70       Heart Rate (Admit)  97 bpm       Heart Rate (Exercise)  129 bpm       Heart Rate (Exit)  104 bpm       Rating of Perceived Exertion (Exercise)  13       Symptoms  none       Duration  Continue with 30 min of aerobic exercise without signs/symptoms of physical distress.       Intensity  THRR unchanged         Progression   Progression  Continue to progress workloads to maintain intensity without signs/symptoms of physical distress.       Average METs  6.1         Resistance Training   Training Prescription  Yes       Weight  7 lbs       Reps  10-15         Interval Training   Interval Training  Yes       Equipment  Recumbant Bike;NuStep;Treadmill  Comments  2 min off 1 min on         Treadmill   MPH  3       Grade  10       Minutes  15       METs  7.4         NuStep   Level  5       Minutes  15       METs  6.1         REL-XR   Level  4       Minutes  15       METs  5.5         Home Exercise Plan   Plans to continue exercise at  Home (comment) walking, join a gym       Frequency  Add 2 additional days to  program exercise sessions.       Initial Home Exercises Provided  12/30/18          Exercise Comments: Exercise Comments    Row Name 12/16/18 9892 01/11/19 1253 01/15/19 0741       Exercise Comments  First full day of exercise!  Patient was oriented to gym and equipment including functions, settings, policies, and procedures.  Patient's individual exercise prescription and treatment plan were reviewed.  All starting workloads were established based on the results of the 6 minute walk test done at initial orientation visit.  The plan for exercise progression was also introduced and progression will be customized based on patient's performance and goals.  Nicholas Weaver called to let us know he had a cath last week and now has swelling/hematoma in groin. Md has checked this and is watching it.  Nicholas Weaver will return when able to exercise.  Nicholas Weaver returned today from cath.  He is still a little sore and walking on treadmill today.  He is going to try to do as much of his workloads as possible.        Exercise Goals and Review: Exercise Goals    Row Name 12/14/18 1620             Exercise Goals   Increase Physical Activity  Yes       Intervention  Provide advice, education, support and counseling about physical activity/exercise needs.;Develop an individualized exercise prescription for aerobic and resistive training based on initial evaluation findings, risk stratification, comorbidities and participant's personal goals.       Expected Outcomes  Short Term: Attend rehab on a regular basis to increase amount of physical activity.;Long Term: Add in home exercise to make exercise part of routine and to increase amount of physical activity.;Long Term: Exercising regularly at least 3-5 days a week.       Increase Strength and Stamina  Yes       Intervention  Provide advice, education, support and counseling about physical activity/exercise needs.;Develop an individualized exercise prescription for aerobic  and resistive training based on initial evaluation findings, risk stratification, comorbidities and participant's personal goals.       Expected Outcomes  Short Term: Increase workloads from initial exercise prescription for resistance, speed, and METs.;Short Term: Perform resistance training exercises routinely during rehab and add in resistance training at home;Long Term: Improve cardiorespiratory fitness, muscular endurance and strength as measured by increased METs and functional capacity (6MWT)       Able to understand and use rate of perceived exertion (RPE) scale  Yes       Intervention  Provide education and explanation on how to use RPE scale       Expected Outcomes  Short Term: Able to use RPE daily in rehab to express subjective intensity level;Long Term:  Able to use RPE to guide intensity level when exercising independently       Knowledge and understanding of Target Heart Rate Range (THRR)  Yes       Intervention  Provide education and explanation of THRR including how the numbers were predicted and where they are located for reference       Expected Outcomes  Short Term: Able to state/look up THRR;Short Term: Able to use daily as guideline for intensity in rehab;Long Term: Able to use THRR to govern intensity when exercising independently       Able to check pulse independently  Yes       Intervention  Provide education and demonstration on how to check pulse in carotid and radial arteries.;Review the importance of being able to check your own pulse for safety during independent exercise       Expected Outcomes  Short Term: Able to explain why pulse checking is important during independent exercise;Long Term: Able to check pulse independently and accurately       Understanding of Exercise Prescription  Yes       Intervention  Provide education, explanation, and written materials on patient's individual exercise prescription       Expected Outcomes  Short Term: Able to explain program  exercise prescription;Long Term: Able to explain home exercise prescription to exercise independently          Exercise Goals Re-Evaluation : Exercise Goals Re-Evaluation    Row Name 12/16/18 0740 12/30/18 1305 01/01/19 0820 01/04/19 0752 01/12/19 1543     Exercise Goal Re-Evaluation   Exercise Goals Review  Increase Physical Activity;Increase Strength and Stamina;Able to understand and use rate of perceived exertion (RPE) scale;Knowledge and understanding of Target Heart Rate Range (THRR);Understanding of Exercise Prescription  Increase Physical Activity;Increase Strength and Stamina;Able to understand and use rate of perceived exertion (RPE) scale;Knowledge and understanding of Target Heart Rate Range (THRR);Understanding of Exercise Prescription;Able to check pulse independently  Increase Physical Activity;Increase Strength and Stamina;Able to understand and use rate of perceived exertion (RPE) scale;Knowledge and understanding of Target Heart Rate Range (THRR);Able to check pulse independently;Understanding of Exercise Prescription  Increase Physical Activity;Increase Strength and Stamina;Understanding of Exercise Prescription  Increase Physical Activity;Increase Strength and Stamina;Understanding of Exercise Prescription   Comments  Reviewed RPE scale, THR and program prescription with pt today.  Pt voiced understanding and was given a copy of goals to take home.  Marvelous has added intervals to hiw work.  He works above 6 METS during intervals.  Staff will monitor progress.  Reviewed home exercise with pt today.  Pt plans to join a gym for exercise.  Reviewed THR, pulse, RPE, sign and symptoms, NTG use, and when to call 911 or MD.  Also discussed weather considerations and indoor options.  Pt voiced understanding.  Nicholas Weaver is doing well in rehab.  He is already walking at home on his off days.  He is using 5 lbs weights at home three times a week.  He feels that his strength and stamina are starting to  recover.  Nicholas Weaver continues to do well in rehab.  Last week he had a transplant work up with a cath that left him with a hematoma, thus he is out this week.  He had worked up to level 5  on the NuStep.  We will continue to monitor his progress upon return.   Expected Outcomes  Short: Use RPE daily to regulate intensity. Long: Follow program prescription in THR.  Short - continue to build stamina with intervals Long - improve overall MET level  Short - add 1-2 days of cardio outside program sessions Long - maintain exercise on his own  Short: Continue to walk at home. Long: Continue to improve strength and stamina.  Short: Return to rehab.  Long: Continue to use intervals.   Nicholas Weaver Name 02/02/19 1017 02/03/19 0751 02/10/19 1431 02/24/19 1220       Exercise Goal Re-Evaluation   Exercise Goals Review  Increase Physical Activity;Increase Strength and Stamina;Understanding of Exercise Prescription  Increase Physical Activity;Increase Strength and Stamina;Understanding of Exercise Prescription  Increase Physical Activity;Increase Strength and Stamina;Able to understand and use rate of perceived exertion (RPE) scale;Knowledge and understanding of Target Heart Rate Range (THRR);Able to check pulse independently;Understanding of Exercise Prescription  Increase Physical Activity;Increase Strength and Stamina;Understanding of Exercise Prescription    Comments  Nicholas Weaver is doing well in rehab.  He has been able to get back to his full workloads again and is doing intervals.  He is up to 5.5 METs on the Recumbent Ellipitical.  We will continue to monitor his progress.  Nicholas Weaver has continued to do well in rehab.  He is exercising at home on his off days by walking and even on some of the days he comes to class.  He is regaining his strength and stamina.  Nicholas Weaver has improved overall MEt level to 5.45.  He is up to 6 lb for strength work.  Nicholas Weaver is doing well in rehab.  He has now added in intervals on the treadmill with his  incline.  He is currently out for vacation but we will continue to monitor his progress once he returns.    Expected Outcomes  Short: Continue to add intervals and try again on treadmill. Long: Continue to improve stamina.  Short: Continue to add intervals and try again on treadmill. Long: Continue to improve stamina.  Short - continue to exercise consistently Long - improve overall MET level  Short:Exercise while out on vacation.  Long: Continue to build stamina.       Discharge Exercise Prescription (Final Exercise Prescription Changes): Exercise Prescription Changes - 02/24/19 1200      Response to Exercise   Blood Pressure (Admit)  110/70    Blood Pressure (Exercise)  142/70    Blood Pressure (Exit)  120/70    Heart Rate (Admit)  97 bpm    Heart Rate (Exercise)  129 bpm    Heart Rate (Exit)  104 bpm    Rating of Perceived Exertion (Exercise)  13    Symptoms  none    Duration  Continue with 30 min of aerobic exercise without signs/symptoms of physical distress.    Intensity  THRR unchanged      Progression   Progression  Continue to progress workloads to maintain intensity without signs/symptoms of physical distress.    Average METs  6.1      Resistance Training   Training Prescription  Yes    Weight  7 lbs    Reps  10-15      Interval Training   Interval Training  Yes    Equipment  Recumbant Bike;NuStep;Treadmill    Comments  2 min off 1 min on      Treadmill   MPH  3  Grade  10    Minutes  15    METs  7.4      NuStep   Level  5    Minutes  15    METs  6.1      REL-XR   Level  4    Minutes  15    METs  5.5      Home Exercise Plan   Plans to continue exercise at  Home (comment)   walking, join a gym   Frequency  Add 2 additional days to program exercise sessions.    Initial Home Exercises Provided  12/30/18       Nutrition:  Target Goals: Understanding of nutrition guidelines, daily intake of sodium '1500mg'$ , cholesterol '200mg'$ , calories 30% from fat and  7% or less from saturated fats, daily to have 5 or more servings of fruits and vegetables.  Biometrics: Pre Biometrics - 12/14/18 1620      Pre Biometrics   Height  5' 11.25" (1.81 m)    Weight  229 lb 6.4 oz (104.1 kg)    BMI (Calculated)  31.76        Nutrition Therapy Plan and Nutrition Goals: Nutrition Therapy & Goals - 12/14/18 1652      Nutrition Therapy   Diet  Low Na HH diet    Drug/Food Interactions  Coumadin/Vit K    Protein (specify units)  84g    Fiber  30 grams    Whole Grain Foods  3 servings    Saturated Fats  12 max. grams    Fruits and Vegetables  5 servings/day    Sodium  1.5 grams      Personal Nutrition Goals   Nutrition Goal  ST: lower Na (change B and L - oatmeal and sandwhich respectively) LT: be able to be there for his kids, increase functionality and functional life.    Comments  Pt is very motivated; encouraged small changes that build up over time. Pt on fluid pill but doesnt check swelling or weight, stressed the importance of that. Pt reports that fluid recommendations by doctors is 2L. Pt reports wanting to reduce Na first due to breathing and fluid; told pt that it was important but also a piece to the puzzle as there are other factors in his diet such as eating healthy fat, fiber, healthy CHOs. Discussed fiber needs and how to increase, discussed whole grain foods, HH eating and low Na eating told pt 1500-'2000mg'$  is a good range to stay in and '2000mg'$  may be more realistic; also discussed importance of getting enough Na - don't want to go too low in Na. Discussed set point and the importance of healthy outcomes and eating enough for his heart (try not to restrict calories past needs). Pt was taking notes and seemed to be ready for changes; some language regarding diet and all or nothing language was noticed. Discussed again small changes and sustainability - this is for long term. Pt reports eating out 3 meals per day (bacon bisuit for B, chick-fil-a for L,  and take out for dinner). Pt reports likeing mustanrd and fish now where before he didnt.      Intervention Plan   Intervention  Prescribe, educate and counsel regarding individualized specific dietary modifications aiming towards targeted core components such as weight, hypertension, lipid management, diabetes, heart failure and other comorbidities.;Nutrition handout(s) given to patient.    Expected Outcomes  Short Term Goal: Understand basic principles of dietary content, such as calories, fat,  sodium, cholesterol and nutrients.;Short Term Goal: A plan has been developed with personal nutrition goals set during dietitian appointment.;Long Term Goal: Adherence to prescribed nutrition plan.       Nutrition Assessments: Nutrition Assessments - 12/07/18 1718      MEDFICTS Scores   Pre Score  125       Nutrition Goals Re-Evaluation: Nutrition Goals Re-Evaluation    Row Name 02/23/19 1144             Goals   Nutrition Goal  ST: lower Na (change B and L - oatmeal and sandwhich respectively) LT: be able to be there for his kids, increase functionality and functional life.       Comment  Continue with current changes       Expected Outcome  ST: lower Na (change B and L - oatmeal and sandwhich respectively) LT: be able to be there for his kids, increase functionality and functional life.          Nutrition Goals Discharge (Final Nutrition Goals Re-Evaluation): Nutrition Goals Re-Evaluation - 02/23/19 1144      Goals   Nutrition Goal  ST: lower Na (change B and L - oatmeal and sandwhich respectively) LT: be able to be there for his kids, increase functionality and functional life.    Comment  Continue with current changes    Expected Outcome  ST: lower Na (change B and L - oatmeal and sandwhich respectively) LT: be able to be there for his kids, increase functionality and functional life.       Psychosocial: Target Goals: Acknowledge presence or absence of significant depression  and/or stress, maximize coping skills, provide positive support system. Participant is able to verbalize types and ability to use techniques and skills needed for reducing stress and depression.   Initial Review & Psychosocial Screening: Initial Psych Review & Screening - 12/07/18 1244      Initial Review   Current issues with  Current Sleep Concerns   Not sleeping as well in past 2 months.  Waking up frequently.     Family Dynamics   Good Support System?  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      Screening Interventions   Interventions  Encouraged to exercise;To provide support and resources with identified psychosocial needs;Provide feedback about the scores to participant    Expected Outcomes  Short Term goal: Utilizing psychosocial counselor, staff and physician to assist with identification of specific Stressors or current issues interfering with healing process. Setting desired goal for each stressor or current issue identified.;Long Term Goal: Stressors or current issues are controlled or eliminated.;Short Term goal: Identification and review with participant of any Quality of Life or Depression concerns found by scoring the questionnaire.;Long Term goal: The participant improves quality of Life and PHQ9 Scores as seen by post scores and/or verbalization of changes       Quality of Life Scores:  Quality of Life - 12/07/18 1716      Quality of Life   Select  Quality of Life      Quality of Life Scores   Health/Function Pre  15.8 %    Socioeconomic Pre  18.56 %    Psych/Spiritual Pre  17.07 %    Family Pre  27.6 %  GLOBAL Pre  18.37 %      Scores of 19 and below usually indicate a poorer quality of life in these areas.  A difference of  2-3 points  is a clinically meaningful difference.  A difference of 2-3 points in the total score of the Quality of Life Index has been associated with significant improvement in overall quality of life, self-image, physical symptoms, and general health in studies assessing change in quality of life.  PHQ-9: Recent Review Flowsheet Data    Depression screen [facility] 2/9 01/15/2019 12/14/2018   Decreased Interest 0 2   Down, Depressed, Hopeless 0 1   PHQ - 2 Score 0 3   Altered sleeping 0 2   Tired, decreased energy 1 2   Change in appetite 0 1   Feeling bad or failure about yourself  0 3   Trouble concentrating 0 1   Moving slowly or fidgety/restless 0 0   Suicidal thoughts 0 0   PHQ-9 Score 1 12   Difficult doing work/chores Not difficult at all Somewhat difficult     Interpretation of Total Score  Total Score Depression Severity:  1-4 = Minimal depression, 5-9 = Mild depression, 10-14 = Moderate depression, 15-19 = Moderately severe depression, 20-27 = Severe depression   Psychosocial Evaluation and Intervention: Psychosocial Evaluation - 12/07/18 1712      Psychosocial Evaluation & Interventions   Interventions  Relaxation education;Stress management education;Encouraged to exercise with the program and follow exercise prescription    Comments  Nicholas Weaver has no barriers to the program. He does state that he has some sleep concerns. Waking up to go to the bathroom and not falling asleep immediately, occuring in the past few months. 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 Will review QOL scores with Nicholas Weaver during his next visit. Nicholas Weaver feels deconditioned and hopes to see improvement in his exercise capacity..    Expected Outcomes  STG: [place] will see improvement in energy levels and hopefully sleep pattern.  :LTG: continued improvements and higher QOL scores at discharge    Continue Psychosocial Services   Follow up  required by staff       Psychosocial Re-Evaluation: Psychosocial Re-Evaluation    Nicholas Weaver Name 01/04/19 0753 01/15/19 0740 02/03/19 0753         Psychosocial Re-Evaluation   Current issues with  Current Sleep Concerns;Current Stress Concerns  Current Stress Concerns  Current Stress Concerns     Comments  Champion is doing well in rehab.  He is working on weight loss and gets frustrated when he gains a little weight but it usually comes back off. He is sleeping better and he has not been using the medicine and feeling good!!  Overall he is doing good.  He has found that the schedule and accountabilty of being in class has been very helpful.  Reviewed patient health questionnaire (PHQ-9) with patient for follow up. Previously, patients score indicated signs/symptoms of depression.  Reviewed to see if patient is improving symptom wise while in program.  Score improved and patient states that it is because they have been able to do more since starting exercise.  Right now, he is hurting from the hemotoma from his cath last week.  Nicholas Weaver is doing well mentally.  His weight is up some but better overall just up vacation.  He is sleeping well.  Overall, staying positive and feeling good.  Expected Outcomes  Short: Continue to attend class regularly.  Long: Continue to get better sleep.  Short: Continue to attend class regularly.  Long: Continue to get better sleep.  Short: Continue to stay positive.  Long: Continue to practice self care.     Interventions  Encouraged to attend Cardiac Rehabilitation for the exercise  Encouraged to attend Cardiac Rehabilitation for the exercise  Encouraged to attend Cardiac Rehabilitation for the exercise     Continue Psychosocial Services   Follow up required by staff  -  Follow up required by staff        Psychosocial Discharge (Final Psychosocial Re-Evaluation): Psychosocial Re-Evaluation - 02/03/19 0753      Psychosocial Re-Evaluation   Current issues with  Current  Stress Concerns    Comments  Nicholas Weaver is doing well mentally.  His weight is up some but better overall just up vacation.  He is sleeping well.  Overall, staying positive and feeling good.    Expected Outcomes  Short: Continue to stay positive.  Long: Continue to practice self care.    Interventions  Encouraged to attend Cardiac Rehabilitation for the exercise    Continue Psychosocial Services   Follow up required by staff       Vocational Rehabilitation: Provide vocational rehab assistance to qualifying candidates.   Vocational Rehab Evaluation & Intervention: Vocational Rehab - 12/07/18 1248      Initial Vocational Rehab Evaluation & Intervention   Assessment shows need for Vocational Rehabilitation  No       Education: Education Goals: Education classes will be provided on a variety of topics geared toward better understanding of heart health and risk factor modification. Participant will state understanding/return demonstration of topics presented as noted by education test scores.  Learning Barriers/Preferences: Learning Barriers/Preferences - 12/07/18 1248      Learning Barriers/Preferences   Learning Barriers  None    Learning Preferences  None       Education Topics:  AED/CPR: - Group verbal and written instruction with the use of models to demonstrate the basic use of the AED with the basic ABC's of resuscitation.   General Nutrition Guidelines/Fats and Fiber: -Group instruction provided by verbal, written material, models and posters to present the general guidelines for heart healthy nutrition. Gives an explanation and review of dietary fats and fiber.   Controlling Sodium/Reading Food Labels: -Group verbal and written material supporting the discussion of sodium use in heart healthy nutrition. Review and explanation with models, verbal and written materials for utilization of the food label.   Exercise Physiology & General Exercise Guidelines: - Group verbal  and written instruction with models to review the exercise physiology of the cardiovascular system and associated critical values. Provides general exercise guidelines with specific guidelines to those with heart or lung disease.    Aerobic Exercise & Resistance Training: - Gives group verbal and written instruction on the various components of exercise. Focuses on aerobic and resistive training programs and the benefits of this training and how to safely progress through these programs..   Flexibility, Balance, Mind/Body Relaxation: Provides group verbal/written instruction on the benefits of flexibility and balance training, including mind/body exercise modes such as yoga, pilates and tai chi.  Demonstration and skill practice provided.   Stress and Anxiety: - Provides group verbal and written instruction about the health risks of elevated stress and causes of high stress.  Discuss the correlation between heart/lung disease and anxiety and treatment options. Review healthy ways to manage with stress  and anxiety.   Depression: - Provides group verbal and written instruction on the correlation between heart/lung disease and depressed mood, treatment options, and the stigmas associated with seeking treatment.   Anatomy & Physiology of the Heart: - Group verbal and written instruction and models provide basic cardiac anatomy and physiology, with the coronary electrical and arterial systems. Review of Valvular disease and Heart Failure   Cardiac Procedures: - Group verbal and written instruction to review commonly prescribed medications for heart disease. Reviews the medication, class of the drug, and side effects. Includes the steps to properly store meds and maintain the prescription regimen. (beta blockers and nitrates)   Cardiac Medications I: - Group verbal and written instruction to review commonly prescribed medications for heart disease. Reviews the medication, class of the drug, and  side effects. Includes the steps to properly store meds and maintain the prescription regimen.   Cardiac Medications II: -Group verbal and written instruction to review commonly prescribed medications for heart disease. Reviews the medication, class of the drug, and side effects. (all other drug classes)    Go Sex-Intimacy & Heart Disease, Get SMART - Goal Setting: - Group verbal and written instruction through game format to discuss heart disease and the return to sexual intimacy. Provides group verbal and written material to discuss and apply goal setting through the application of the S.M.A.R.T. Method.   Other Matters of the Heart: - Provides group verbal, written materials and models to describe Stable Angina and Peripheral Artery. Includes description of the disease process and treatment options available to the cardiac patient.   Exercise & Equipment Safety: - Individual verbal instruction and demonstration of equipment use and safety with use of the equipment.   Cardiac Rehab from 12/14/2018 in [facility] Cardiac and Pulmonary Rehab  Date  12/14/18  Educator  AS  Instruction Review Code  1- Verbalizes Understanding      Infection Prevention: - Provides verbal and written material to individual with discussion of infection control including proper hand washing and proper equipment cleaning during exercise session.   Cardiac Rehab from 12/14/2018 in [facility] Cardiac and Pulmonary Rehab  Date  12/14/18  Educator  AS  Instruction Review Code  1- Verbalizes Understanding      Falls Prevention: - Provides verbal and written material to individual with discussion of falls prevention and safety.   Diabetes: - Individual verbal and written instruction to review signs/symptoms of diabetes, desired ranges of glucose level fasting, after meals and with exercise. Acknowledge that pre and post exercise glucose checks will be done for 3 sessions at entry of program.   Know Your Numbers and  Risk Factors: -Group verbal and written instruction about important numbers in your health.  Discussion of what are risk factors and how they play a role in the disease process.  Review of Cholesterol, Blood Pressure, Diabetes, and BMI and the role they play in your overall health.   Sleep Hygiene: -Provides group verbal and written instruction about how sleep can affect your health.  Define sleep hygiene, discuss sleep cycles and impact of sleep habits. Review good sleep hygiene tips.    Other: -Provides group and verbal instruction on various topics (see comments)   Knowledge Questionnaire Score: Knowledge Questionnaire Score - 12/07/18 1718      Knowledge Questionnaire Score   Pre Score  23/26  MIssed angina, nutrition and exercise questions       Core Components/Risk Factors/Patient Goals at Admission: Personal Goals and Risk Factors at Admission -  12/14/18 1622      Core Components/Risk Factors/Patient Goals on Admission    Weight Management  Weight Loss;Obesity    Intervention  Weight Management/Obesity: Establish reasonable short term and long term weight goals.    Admit Weight  229 lb 6.4 oz (104.1 kg)    Goal Weight: Long Term  200 lb (90.7 kg)    Expected Outcomes  Short Term: Continue to assess and modify interventions until short term weight is achieved;Long Term: Adherence to nutrition and physical activity/exercise program aimed toward attainment of established weight goal;Weight Loss: Understanding of general recommendations for a balanced deficit meal plan, which promotes 1-2 lb weight loss per week and includes a negative energy balance of [phone] kcal/d;Weight Maintenance: Understanding of the daily nutrition guidelines, which includes 25-35% calories from fat, 7% or less cal from saturated fats, less than '200mg'$  cholesterol, less than 1.5gm of sodium, & 5 or more servings of fruits and vegetables daily;Understanding recommendations for meals to include 15-35% energy as  protein, 25-35% energy from fat, 35-60% energy from carbohydrates, less than '200mg'$  of dietary cholesterol, 20-35 gm of total fiber daily;Understanding of distribution of calorie intake throughout the day with the consumption of 4-5 meals/snacks;Weight Gain: Understanding of general recommendations for a high calorie, high protein meal plan that promotes weight gain by distributing calorie intake throughout the day with the consumption for 4-5 meals, snacks, and/or supplements    Intervention  Provide education and support for participant on nutrition & aerobic/resistive exercise along with prescribed medications to achieve LDL '70mg'$ , HDL >'40mg'$ .    Expected Outcomes  Short Term: Participant states understanding of desired cholesterol values and is compliant with medications prescribed. Participant is following exercise prescription and nutrition guidelines.;Long Term: Cholesterol controlled with medications as prescribed, with individualized exercise RX and with personalized nutrition plan. Value goals: LDL < '70mg'$ , HDL > 40 mg.       Core Components/Risk Factors/Patient Goals Review:  Goals and Risk Factor Review    Row Name 01/04/19 0755 02/03/19 0755           Core Components/Risk Factors/Patient Goals Review   Personal Goals Review  Weight Management/Obesity;Lipids  Weight Management/Obesity;Lipids      Review  Nicholas Weaver is doing well in rehab. His weight is coming down; today he was 220lb!!  He continues to work on this with exercise and diet.  He is doing well with his medications.  Nicholas Weaver continues to do well in rehab.  His weight was up after a week off and being at the beach.  He is doing well with his blood pressures and medications.  Overall, he is good.      Expected Outcomes  Short: Continue to work on weight loss.  Long: Continue to montior risk factors.  Short: Continue to work on weight loss.  Long: Continue to montior risk factors.         Core Components/Risk Factors/Patient Goals at  Discharge (Final Review):  Goals and Risk Factor Review - 02/03/19 0755      Core Components/Risk Factors/Patient Goals Review   Personal Goals Review  Weight Management/Obesity;Lipids    Review  Nicholas Weaver continues to do well in rehab.  His weight was up after a week off and being at the beach.  He is doing well with his blood pressures and medications.  Overall, he is good.    Expected Outcomes  Short: Continue to work on weight loss.  Long: Continue to montior risk factors.  ITP Comments: ITP Comments    Row Name 12/07/18 1432 12/14/18 1614 12/30/18 0553 01/11/19 1253 01/27/19 1041   ITP Comments  Virtual Orientation completed   Appt made for 8/24 to complete EP/RD Evals and gym orientation  Completed initital 6MWT and nutrition eval.  ITP created  and sent for review  30 Day review. Continue with ITP unless directed changes per Medical Director review.   New to program  Nicholas Weaver called to let us know he had a cath last week and now has swelling/hematoma in groin. Md has checked this and is watching it.  Nicholas Weaver will return when able to exercise.  30 day review completed. ITP sent to Dr. Emily Filbert, Medical Director of Cardiac and Pulmonary Rehab. Continue with ITP unless changes are made by physician.  Department closed starting 10/2 until further notice by infection prevention and Health at Work teams for [place].   Nicholas Weaver Name 02/24/19 0618 02/24/19 1220 03/03/19 0814       ITP Comments  30 day review completed. Continue with ITP sent to Dr. Emily Filbert, Medical Director of Cardiac and Pulmonary Rehab for review , changes as needed and signature.  Nicholas Weaver is out on vacation and scheduled to return next week.  Nicholas Weaver called today and needs to discharge from program  His work scheule has become full and he is not able to return to CR.        Comments: Discharged per patient request   Return to work

## 2019-03-03 NOTE — Progress Notes (Signed)
Discharge Progress Report  Patient Details  Name: Nicholas Weaver MRN: [id number] Date of Birth: [birth year] Referring Provider:     Cardiac Rehab from 12/14/2018 in [facility]  Referring Provider  [place]       Number of Visits: 17/36  Reason for Discharge:  Early Exit:  Back to work  Smoking History:  Social History   Tobacco Use  Smoking Status Never Smoker  Smokeless Tobacco Never Used    Diagnosis:  Status post heart transplant ([place])  ADL [facility]:   Initial Exercise Prescription: Initial Exercise Prescription - 12/14/18 1600      Date of Initial Exercise RX and Referring Provider   Date  12/14/18    Referring Provider  McGarrah      Treadmill   MPH  3.1    Grade  1.5    Minutes  15    METs  4      NuStep   Level  4    SPM  80    Minutes  15    METs  4      REL-XR   Level  3    Speed  50    Minutes  15    METs  4      Prescription Details   Frequency (times per week)  3    Duration  Progress to 30 minutes of continuous aerobic without signs/symptoms of physical distress      Intensity   THRR 40-80% of Max Heartrate  128-160    Ratings of Perceived Exertion  11-15    Perceived Dyspnea  0-4      Resistance Training   Training Prescription  Yes    Weight  3 lb    Reps  10-15       Discharge Exercise Prescription (Final Exercise Prescription Changes): Exercise Prescription Changes - 02/24/19 1200      Response to Exercise   Blood Pressure (Admit)  110/70    Blood Pressure (Exercise)  142/70    Blood Pressure (Exit)  120/70    Heart Rate (Admit)  97 bpm    Heart Rate (Exercise)  129 bpm    Heart Rate (Exit)  104 bpm    Rating of Perceived Exertion (Exercise)  13    Symptoms  none    Duration  Continue with 30 min of aerobic exercise without signs/symptoms of physical distress.    Intensity  THRR unchanged      Progression   Progression  Continue to progress workloads to maintain intensity without signs/symptoms  of physical distress.    Average METs  6.1      Resistance Training   Training Prescription  Yes    Weight  7 lbs    Reps  10-15      Interval Training   Interval Training  Yes    Equipment  Recumbant Bike;NuStep;Treadmill    Comments  2 min off 1 min on      Treadmill   MPH  3    Grade  10    Minutes  15    METs  7.4      NuStep   Level  5    Minutes  15    METs  6.1      REL-XR   Level  4    Minutes  15    METs  5.5      Home Exercise Plan   Plans to continue exercise at  Home (  comment)   walking, join a gym   Frequency  Add 2 additional days to program exercise sessions.    Initial Home Exercises Provided  12/30/18       Functional Capacity: 6 Minute Walk    Row Name 12/14/18 1615         6 Minute Walk   Distance  1645 feet     Walk Time  6 minutes     # of Rest Breaks  0     MPH  3.11     METS  5.17     RPE  11     Perceived Dyspnea   2     VO2 Peak  18.09     Symptoms  No     Resting HR  97 bpm     Resting BP  124/82     Resting Oxygen Saturation   96 %     Exercise Oxygen Saturation  during 6 min walk  96 %     Max Ex. HR  129 bpm     Max Ex. BP  150/84     2 Minute Post BP  124/78        Psychological, QOL, Others - Outcomes: PHQ 2/9: Depression screen [facility] 2/9 01/15/2019 12/14/2018  Decreased Interest 0 2  Down, Depressed, Hopeless 0 1  PHQ - 2 Score 0 3  Altered sleeping 0 2  Tired, decreased energy 1 2  Change in appetite 0 1  Feeling bad or failure about yourself  0 3  Trouble concentrating 0 1  Moving slowly or fidgety/restless 0 0  Suicidal thoughts 0 0  PHQ-9 Score 1 12  Difficult doing work/chores Not difficult at all Somewhat difficult    Quality of Life: Quality of Life - 12/07/18 1716      Quality of Life   Select  Quality of Life      Quality of Life Scores   Health/Function Pre  15.8 %    Socioeconomic Pre  18.56 %    Psych/Spiritual Pre  17.07 %    Family Pre  27.6 %    GLOBAL Pre  18.37 %       Personal  Goals: Goals established at orientation with interventions provided to work toward goal. Personal Goals and Risk Factors at Admission - 12/14/18 1622      Core Components/Risk Factors/Patient Goals on Admission    Weight Management  Weight Loss;Obesity    Intervention  Weight Management/Obesity: Establish reasonable short term and long term weight goals.    Admit Weight  229 lb 6.4 oz (104.1 kg)    Goal Weight: Long Term  200 lb (90.7 kg)    Expected Outcomes  Short Term: Continue to assess and modify interventions until short term weight is achieved;Long Term: Adherence to nutrition and physical activity/exercise program aimed toward attainment of established weight goal;Weight Loss: Understanding of general recommendations for a balanced deficit meal plan, which promotes 1-2 lb weight loss per week and includes a negative energy balance of [phone] kcal/d;Weight Maintenance: Understanding of the daily nutrition guidelines, which includes 25-35% calories from fat, 7% or less cal from saturated fats, less than 25m cholesterol, less than 1.5gm of sodium, & 5 or more servings of fruits and vegetables daily;Understanding recommendations for meals to include 15-35% energy as protein, 25-35% energy from fat, 35-60% energy from carbohydrates, less than 2072mof dietary cholesterol, 20-35 gm of total fiber daily;Understanding of distribution of calorie intake throughout the day with  the consumption of 4-5 meals/snacks;Weight Gain: Understanding of general recommendations for a high calorie, high protein meal plan that promotes weight gain by distributing calorie intake throughout the day with the consumption for 4-5 meals, snacks, and/or supplements    Intervention  Provide education and support for participant on nutrition & aerobic/resistive exercise along with prescribed medications to achieve LDL <57m, HDL >429m    Expected Outcomes  Short Term: Participant states understanding of desired cholesterol  values and is compliant with medications prescribed. Participant is following exercise prescription and nutrition guidelines.;Long Term: Cholesterol controlled with medications as prescribed, with individualized exercise RX and with personalized nutrition plan. Value goals: LDL < 7024mHDL > 40 mg.        Personal Goals Discharge: Goals and Risk Factor Review    Row Name 01/04/19 0755 02/03/19 0755           Core Components/Risk Factors/Patient Goals Review   Personal Goals Review  Weight Management/Obesity;Lipids  Weight Management/Obesity;Lipids      Review  RodHudsen doing well in rehab. His weight is coming down; today he was 220lb!!  He continues to work on this with exercise and diet.  He is doing well with his medications.  RodBrelandntinues to do well in rehab.  His weight was up after a week off and being at the beach.  He is doing well with his blood pressures and medications.  Overall, he is good.      Expected Outcomes  Short: Continue to work on weight loss.  Long: Continue to montior risk factors.  Short: Continue to work on weight loss.  Long: Continue to montior risk factors.         Exercise Goals and Review: Exercise Goals    Row Name 12/14/18 1620             Exercise Goals   Increase Physical Activity  Yes       Intervention  Provide advice, education, support and counseling about physical activity/exercise needs.;Develop an individualized exercise prescription for aerobic and resistive training based on initial evaluation findings, risk stratification, comorbidities and participant's personal goals.       Expected Outcomes  Short Term: Attend rehab on a regular basis to increase amount of physical activity.;Long Term: Add in home exercise to make exercise part of routine and to increase amount of physical activity.;Long Term: Exercising regularly at least 3-5 days a week.       Increase Strength and Stamina  Yes       Intervention  Provide advice, education, support  and counseling about physical activity/exercise needs.;Develop an individualized exercise prescription for aerobic and resistive training based on initial evaluation findings, risk stratification, comorbidities and participant's personal goals.       Expected Outcomes  Short Term: Increase workloads from initial exercise prescription for resistance, speed, and METs.;Short Term: Perform resistance training exercises routinely during rehab and add in resistance training at home;Long Term: Improve cardiorespiratory fitness, muscular endurance and strength as measured by increased METs and functional capacity (6MWT)       Able to understand and use rate of perceived exertion (RPE) scale  Yes       Intervention  Provide education and explanation on how to use RPE scale       Expected Outcomes  Short Term: Able to use RPE daily in rehab to express subjective intensity level;Long Term:  Able to use RPE to guide intensity level when exercising independently  Knowledge and understanding of Target Heart Rate Range (THRR)  Yes       Intervention  Provide education and explanation of THRR including how the numbers were predicted and where they are located for reference       Expected Outcomes  Short Term: Able to state/look up THRR;Short Term: Able to use daily as guideline for intensity in rehab;Long Term: Able to use THRR to govern intensity when exercising independently       Able to check pulse independently  Yes       Intervention  Provide education and demonstration on how to check pulse in carotid and radial arteries.;Review the importance of being able to check your own pulse for safety during independent exercise       Expected Outcomes  Short Term: Able to explain why pulse checking is important during independent exercise;Long Term: Able to check pulse independently and accurately       Understanding of Exercise Prescription  Yes       Intervention  Provide education, explanation, and written  materials on patient's individual exercise prescription       Expected Outcomes  Short Term: Able to explain program exercise prescription;Long Term: Able to explain home exercise prescription to exercise independently          Exercise Goals Re-Evaluation: Exercise Goals Re-Evaluation    Row Name 12/16/18 0740 12/30/18 1305 01/01/19 0820 01/04/19 0752 01/12/19 1543     Exercise Goal Re-Evaluation   Exercise Goals Review  Increase Physical Activity;Increase Strength and Stamina;Able to understand and use rate of perceived exertion (RPE) scale;Knowledge and understanding of Target Heart Rate Range (THRR);Understanding of Exercise Prescription  Increase Physical Activity;Increase Strength and Stamina;Able to understand and use rate of perceived exertion (RPE) scale;Knowledge and understanding of Target Heart Rate Range (THRR);Understanding of Exercise Prescription;Able to check pulse independently  Increase Physical Activity;Increase Strength and Stamina;Able to understand and use rate of perceived exertion (RPE) scale;Knowledge and understanding of Target Heart Rate Range (THRR);Able to check pulse independently;Understanding of Exercise Prescription  Increase Physical Activity;Increase Strength and Stamina;Understanding of Exercise Prescription  Increase Physical Activity;Increase Strength and Stamina;Understanding of Exercise Prescription   Comments  Reviewed RPE scale, THR and program prescription with pt today.  Pt voiced understanding and was given a copy of goals to take home.  Jatin has added intervals to hiw work.  He works above 6 METS during intervals.  Staff will monitor progress.  Reviewed home exercise with pt today.  Pt plans to join a gym for exercise.  Reviewed THR, pulse, RPE, sign and symptoms, NTG use, and when to call 911 or MD.  Also discussed weather considerations and indoor options.  Pt voiced understanding.  Christos is doing well in rehab.  He is already walking at home on his off  days.  He is using 5 lbs weights at home three times a week.  He feels that his strength and stamina are starting to recover.  Byrl continues to do well in rehab.  Last week he had a transplant work up with a cath that left him with a hematoma, thus he is out this week.  He had worked up to level 5 on the NuStep.  We will continue to monitor his progress upon return.   Expected Outcomes  Short: Use RPE daily to regulate intensity. Long: Follow program prescription in THR.  Short - continue to build stamina with intervals Long - improve overall MET level  Short - add 1-2 days of  cardio outside program sessions Long - maintain exercise on his own  Short: Continue to walk at home. Long: Continue to improve strength and stamina.  Short: Return to rehab.  Long: Continue to use intervals.   [place] Name 02/02/19 1017 02/03/19 0751 02/10/19 1431 02/24/19 1220       Exercise Goal Re-Evaluation   Exercise Goals Review  Increase Physical Activity;Increase Strength and Stamina;Understanding of Exercise Prescription  Increase Physical Activity;Increase Strength and Stamina;Understanding of Exercise Prescription  Increase Physical Activity;Increase Strength and Stamina;Able to understand and use rate of perceived exertion (RPE) scale;Knowledge and understanding of Target Heart Rate Range (THRR);Able to check pulse independently;Understanding of Exercise Prescription  Increase Physical Activity;Increase Strength and Stamina;Understanding of Exercise Prescription    Comments  Nyaire is doing well in rehab.  He has been able to get back to his full workloads again and is doing intervals.  He is up to 5.5 METs on the Recumbent Ellipitical.  We will continue to monitor his progress.  Jabier has continued to do well in rehab.  He is exercising at home on his off days by walking and even on some of the days he comes to class.  He is regaining his strength and stamina.  [place] has improved overall MEt level to 5.45.  He is up to 6  lb for strength work.  Zephyr is doing well in rehab.  He has now added in intervals on the treadmill with his incline.  He is currently out for vacation but we will continue to monitor his progress once he returns.    Expected Outcomes  Short: Continue to add intervals and try again on treadmill. Long: Continue to improve stamina.  Short: Continue to add intervals and try again on treadmill. Long: Continue to improve stamina.  Short - continue to exercise consistently Long - improve overall MET level  Short:Exercise while out on vacation.  Long: Continue to build stamina.       Nutrition & Weight - Outcomes: Pre Biometrics - 12/14/18 1620      Pre Biometrics   Height  5' 11.25" (1.81 m)    Weight  229 lb 6.4 oz (104.1 kg)    BMI (Calculated)  31.76        Nutrition: Nutrition Therapy & Goals - 12/14/18 1652      Nutrition Therapy   Diet  Low Na HH diet    Drug/Food Interactions  Coumadin/Vit K    Protein (specify units)  84g    Fiber  30 grams    Whole Grain Foods  3 servings    Saturated Fats  12 max. grams    Fruits and Vegetables  5 servings/day    Sodium  1.5 grams      Personal Nutrition Goals   Nutrition Goal  ST: lower Na (change B and L - oatmeal and sandwhich respectively) LT: be able to be there for his kids, increase functionality and functional life.    Comments  Pt is very motivated; encouraged small changes that build up over time. Pt on fluid pill but doesnt check swelling or weight, stressed the importance of that. Pt reports that fluid recommendations by doctors is 2L. Pt reports wanting to reduce Na first due to breathing and fluid; told pt that it was important but also a piece to the puzzle as there are other factors in his diet such as eating healthy fat, fiber, healthy CHOs. Discussed fiber needs and how to increase, discussed whole grain foods, HH  eating and low Na eating told pt 1500-2051m is a good range to stay in and 20019mmay be more realistic; also  discussed importance of getting enough Na - don't want to go too low in Na. Discussed set point and the importance of healthy outcomes and eating enough for his heart (try not to restrict calories past needs). Pt was taking notes and seemed to be ready for changes; some language regarding diet and all or nothing language was noticed. Discussed again small changes and sustainability - this is for long term. Pt reports eating out 3 meals per day (bacon bisuit for B, chick-fil-a for L, and take out for dinner). Pt reports likeing mustanrd and fish now where before he didnt.      Intervention Plan   Intervention  Prescribe, educate and counsel regarding individualized specific dietary modifications aiming towards targeted core components such as weight, hypertension, lipid management, diabetes, heart failure and other comorbidities.;Nutrition handout(s) given to patient.    Expected Outcomes  Short Term Goal: Understand basic principles of dietary content, such as calories, fat, sodium, cholesterol and nutrients.;Short Term Goal: A plan has been developed with personal nutrition goals set during dietitian appointment.;Long Term Goal: Adherence to prescribed nutrition plan.       Nutrition Discharge: Nutrition Assessments - 12/07/18 1718      MEDFICTS Scores   Pre Score  125       Education Questionnaire Score: Knowledge Questionnaire Score - 12/07/18 1718      Knowledge Questionnaire Score   Pre Score  23/26  MIssed angina, nutrition and exercise questions       Goals reviewed with patient; copy given to patient.

## 2019-03-17 ENCOUNTER — Ambulatory Visit: Payer: BC Managed Care – PPO

## 2019-03-19 ENCOUNTER — Ambulatory Visit: Payer: BC Managed Care – PPO

## 2019-03-22 ENCOUNTER — Ambulatory Visit: Payer: BC Managed Care – PPO

## 2019-03-24 ENCOUNTER — Ambulatory Visit: Payer: BC Managed Care – PPO

## 2019-03-26 ENCOUNTER — Ambulatory Visit: Payer: BC Managed Care – PPO

## 2019-03-29 ENCOUNTER — Ambulatory Visit: Payer: BC Managed Care – PPO

## 2019-03-31 ENCOUNTER — Ambulatory Visit: Payer: BC Managed Care – PPO

## 2019-04-02 ENCOUNTER — Ambulatory Visit: Payer: BC Managed Care – PPO

## 2019-04-05 ENCOUNTER — Ambulatory Visit: Payer: BC Managed Care – PPO

## 2019-04-07 ENCOUNTER — Ambulatory Visit: Payer: BC Managed Care – PPO

## 2019-04-09 ENCOUNTER — Ambulatory Visit: Payer: BC Managed Care – PPO

## 2019-04-12 ENCOUNTER — Ambulatory Visit: Payer: BC Managed Care – PPO

## 2019-04-14 ENCOUNTER — Ambulatory Visit: Payer: BC Managed Care – PPO

## 2019-04-19 ENCOUNTER — Ambulatory Visit: Payer: BC Managed Care – PPO

## 2019-04-21 ENCOUNTER — Ambulatory Visit: Payer: BC Managed Care – PPO

## 2019-04-26 ENCOUNTER — Ambulatory Visit: Payer: BC Managed Care – PPO

## 2019-04-28 ENCOUNTER — Ambulatory Visit: Payer: BC Managed Care – PPO

## 2019-04-30 ENCOUNTER — Ambulatory Visit: Payer: BC Managed Care – PPO

## 2019-05-03 ENCOUNTER — Ambulatory Visit: Payer: BC Managed Care – PPO

## 2020-01-11 DIAGNOSIS — N182 Chronic kidney disease, stage 2 (mild): Secondary | ICD-10-CM

## 2020-01-11 HISTORY — DX: Chronic kidney disease, stage 2 (mild): N18.2

## 2020-03-01 ENCOUNTER — Other Ambulatory Visit
Admission: RE | Admit: 2020-03-01 | Discharge: 2020-03-01 | Disposition: A | Discharge status: Home / Self Care | Payer: BC Managed Care – PPO | Source: Ambulatory Visit | Attending: Sports Medicine | Admitting: Sports Medicine

## 2020-03-01 DIAGNOSIS — M25522 Pain in left elbow: Secondary | ICD-10-CM | POA: Insufficient documentation

## 2020-03-01 LAB — SYNOVIAL CELL COUNT + DIFF, W/ CRYSTALS
Eosinophils-Synovial: 0 %
Lymphocytes-Synovial Fld: 3 %
Monocyte-Macrophage-Synovial Fluid: 7 %
Neutrophil, Synovial: 90 %
WBC, Synovial: 11220 /mm3 — ABNORMAL HIGH (ref 0–200)

## 2020-03-15 LAB — BODY FLUID CULTURE

## 2020-03-15 LAB — SUSCEPTIBILITY RESULT

## 2020-03-15 LAB — SUSCEPTIBILITY, AER + ANAEROB

## 2020-03-31 IMAGING — US US ABDOMEN COMPLETE
1 series · 13 of 25 positions shown · non-contrast
Comparison: Abdominal and pelvic CT scan September 07, 2010

CLINICAL DATA: Acute pancreatitis, drug induced. Previous
cholecystectomy. History of heart transplant in December 2016.
Recently hospitalized at [REDACTED] [HOSPITAL] for
pancreatitis.

EXAM:
ABDOMEN ULTRASOUND COMPLETE

[Series 1: us abdomen complete · 0.25mm/px · 13 of 83 slices shown]
[im 1/83]
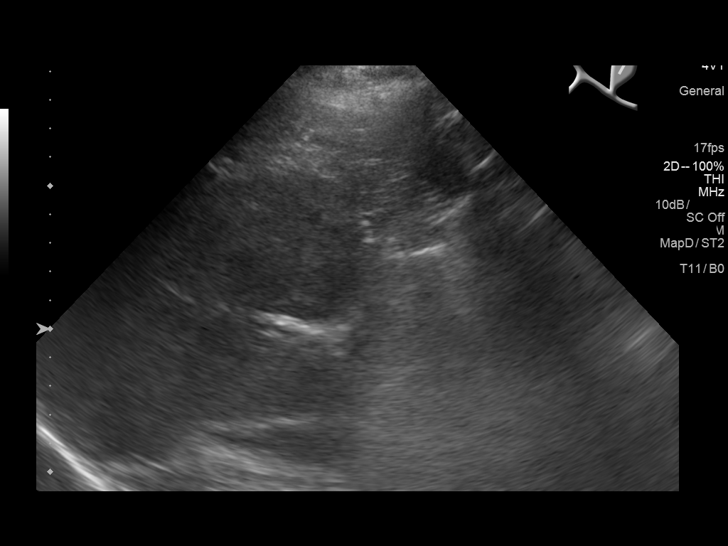
[im 7/83]
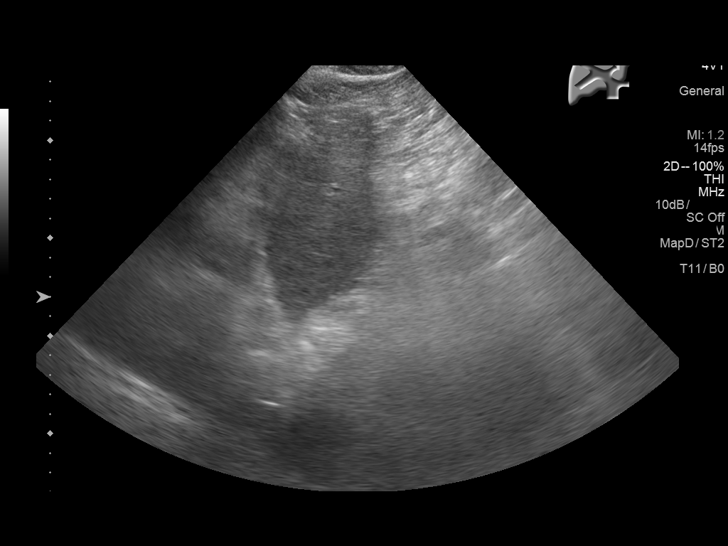
[im 14/83]
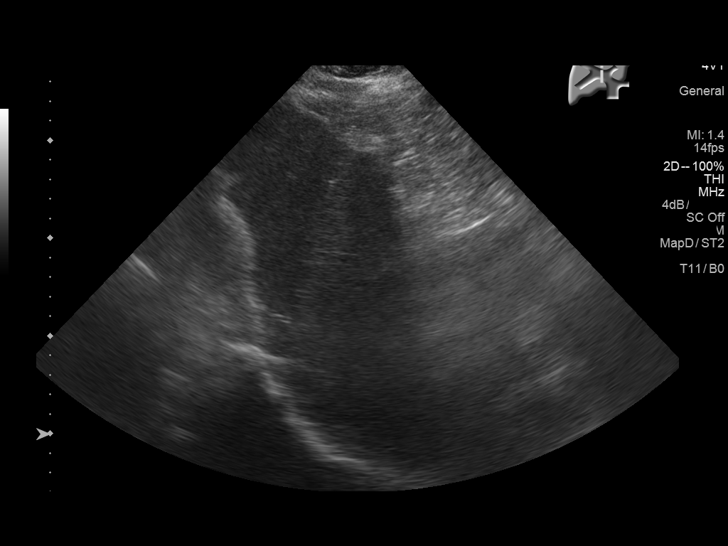
[im 21/83]
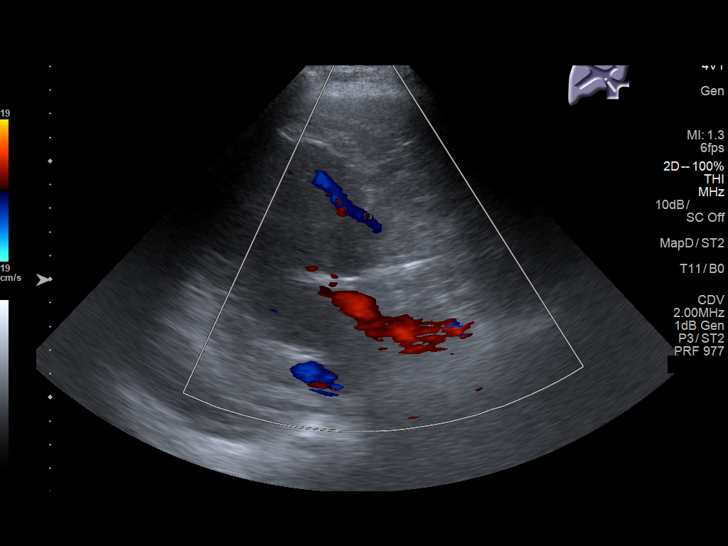
[im 28/83]
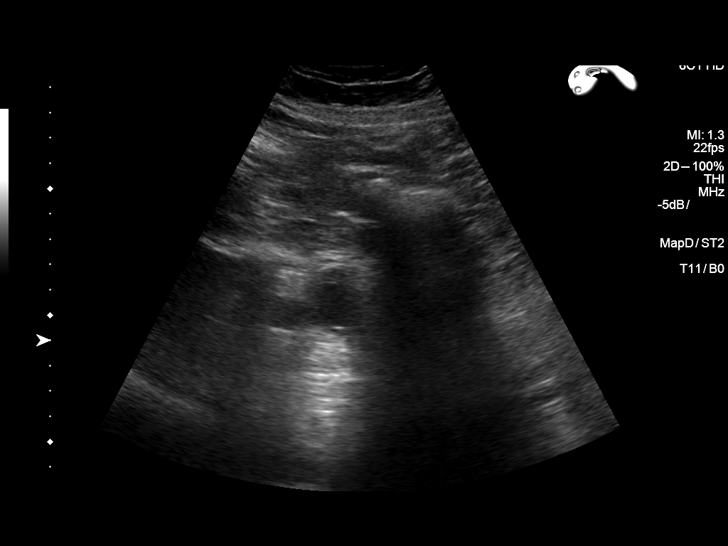
[im 35/83]
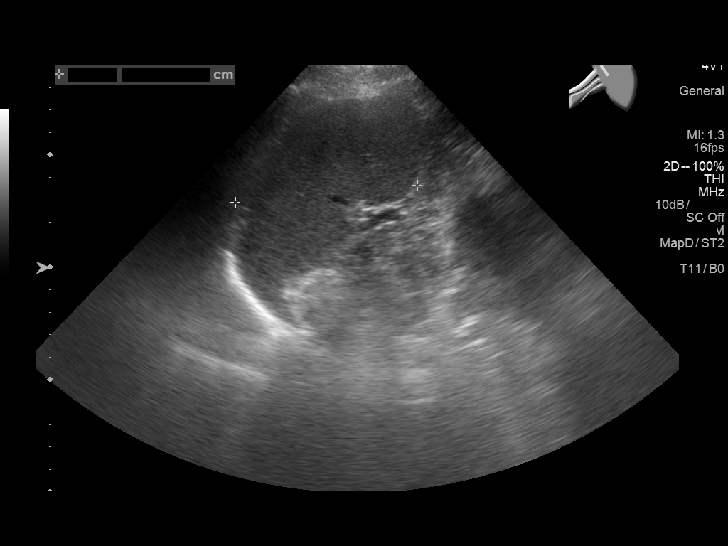
[im 42/83]
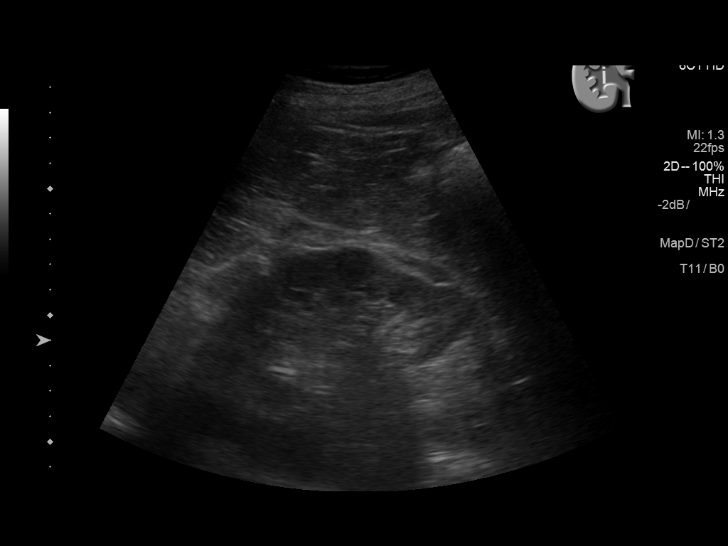
[im 48/83]
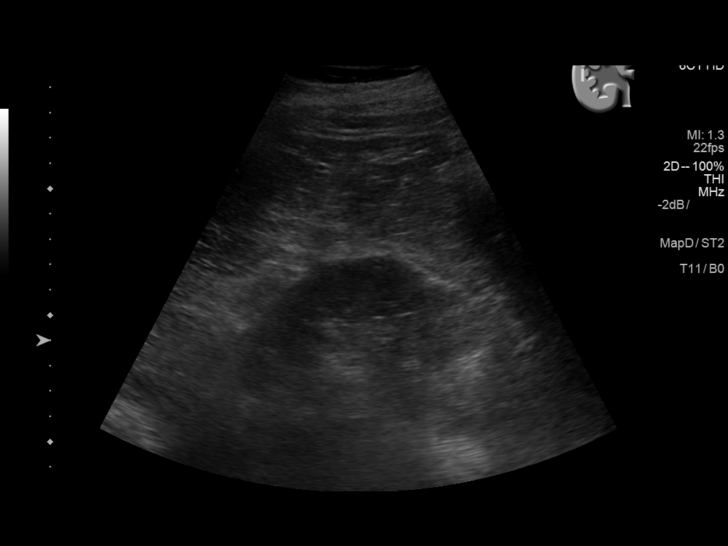
[im 55/83]
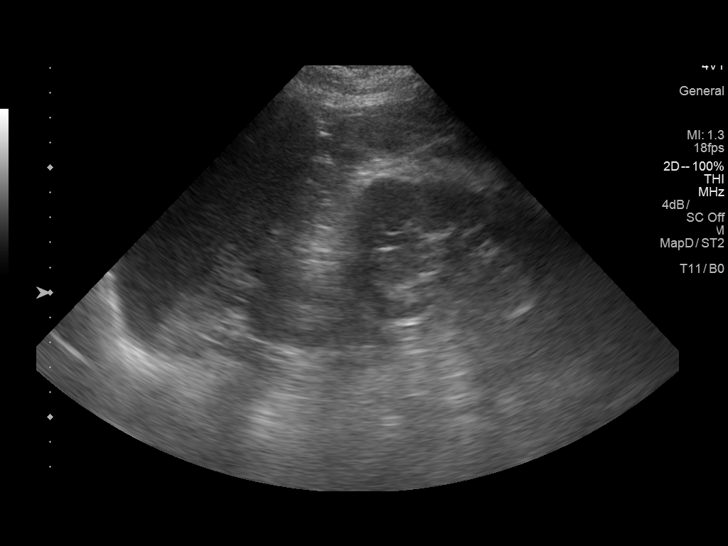
[im 62/83]
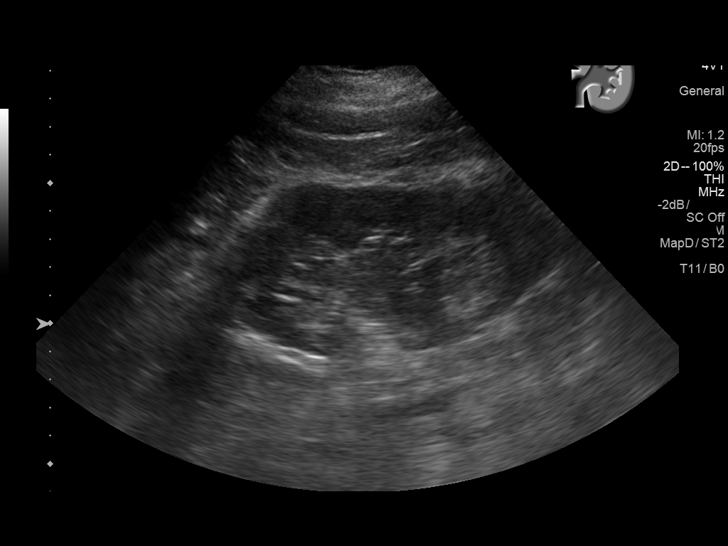
[im 69/83]
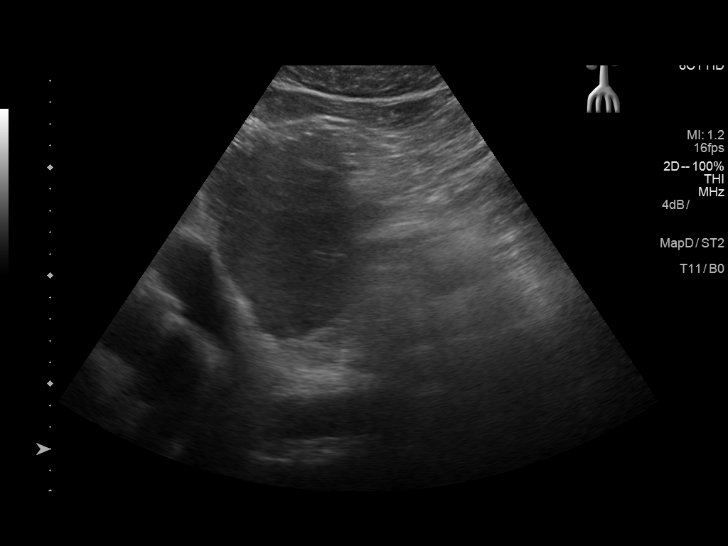
[im 76/83]
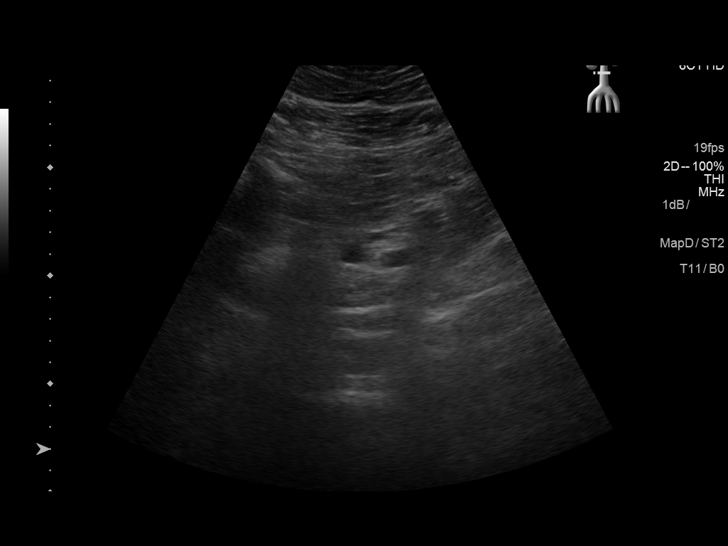
[im 83/83]
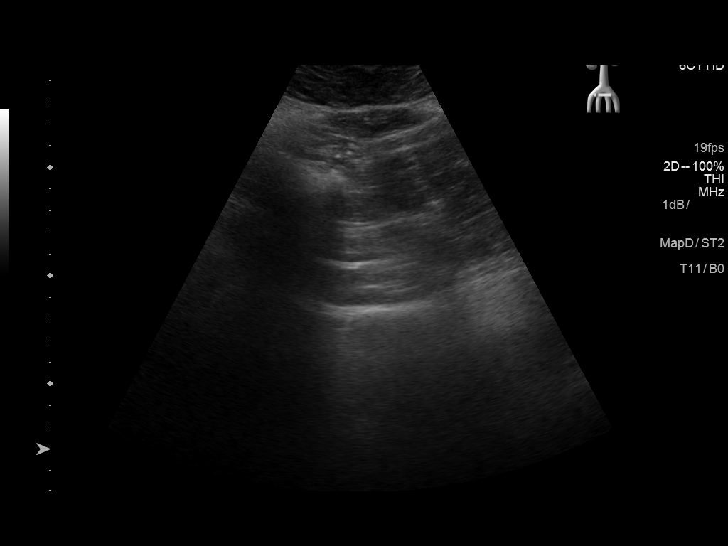

[13 of 25 positions shown; findings below may reference images not displayed]

FINDINGS: Gallbladder: The gallbladder is surgically absent.

Common bile duct: Diameter: 4.2 mm

Liver: The hepatic echotexture is increased. The surface contour is
irregular. The organ appears small. There is no intrahepatic ductal
dilation. Portal vein is patent on color Doppler imaging with normal
direction of blood flow towards the liver.

IVC: No abnormality visualized.

Pancreas: In the pancreatic head there is a cystic structure
measuring 1.8 x 1.7 x 1.5 cm. The pancreatic body appears normal.
The pancreatic tail is partially obscured by bowel gas. No
surrounding fluid is observed.

Spleen: 8.1 cm

Right Kidney: Length: 11.1 cm. Echogenicity within normal limits. No
mass or hydronephrosis visualized.

Left Kidney: Length: 12.6 cm. Echogenicity within normal limits. No
mass or hydronephrosis visualized.

Abdominal aorta: Bowel gas obscures a portion of the abdominal
aorta. The proximal aorta measures 2.1 cm in greatest dimension with
the mid aorta measuring 1.9 cm in greatest dimension and the distal
aorta 1.9 cm in greatest dimension.

Other findings: No ascites is observed.
IMPRESSION: Small pancreatic head pseudocyst measuring 1.8 cm in greatest
dimension. No pancreatic ductal dilation is observed.

Small irregular liver with increased echotexture. The findings are
worrisome for cirrhosis. No focal mass or ductal dilation. Previous
cholecystectomy. Normal sized spleen.

Mild failure to taper of the caliber of the mid and distal aorta
without evidence of an aneurysm.

## 2021-01-05 DIAGNOSIS — T86290 Cardiac allograft vasculopathy: Secondary | ICD-10-CM | POA: Insufficient documentation

## 2021-01-05 HISTORY — DX: Cardiac allograft vasculopathy: T86.290

## 2021-02-09 DIAGNOSIS — I639 Cerebral infarction, unspecified: Secondary | ICD-10-CM

## 2021-02-09 DIAGNOSIS — N419 Inflammatory disease of prostate, unspecified: Secondary | ICD-10-CM

## 2021-02-09 DIAGNOSIS — N12 Tubulo-interstitial nephritis, not specified as acute or chronic: Secondary | ICD-10-CM | POA: Insufficient documentation

## 2021-02-09 HISTORY — DX: Tubulo-interstitial nephritis, not specified as acute or chronic: N12

## 2021-02-09 HISTORY — DX: Cerebral infarction, unspecified: I63.9

## 2021-02-09 HISTORY — DX: Inflammatory disease of prostate, unspecified: N41.9

## 2021-04-05 NOTE — Progress Notes (Signed)
04/13/21 9:14 AM   Etheleen Sia Spooner 09/23/1975 [id number]  Referring provider:  Marguarite Arbour, MD [address] [facility] [place],  [place] [address] Chief Complaint  Patient presents with   Elevated PSA   New Patient (Initial Visit)     HPI: Nicholas Weaver is a 45 y.o.male who presents today for further evaluation of elevated PSA.  He has a personal history transplant, stroke and is on immunosuppression.  He has had two elevated PSA this year. On 02/19/2021 his PSA was 14.96. PSA on 03/21/2021 was 8.05.   He has a personal history of CKD stage II, prostatitis, and cystitis.   He was seen in the ED on 02/09/2021 with fever, chills, dysuria, and back pain. Urinalysis showed 2+ nitrites, 250 leukocytesm 36 WBCs, bacteria. UCx grew Klebsiella.  CT abdomen and pelvis showed Mild stranding involving the left kidney may represent mild pyelonephritis.. There is also haziness and stranding involving the prostate gland and urinary bladder. A process such as prostatitis and/or cystitis could give this appearance. He was given IV antibiotics.  He reports the days leading up to this infection, he started having urinary frequency and urgency.  He has had 2 serious bladder/UTI infections since being on immunosuppression.  At baseline, he denies any urinary symptoms.  He has no family history of prostate cancer or prostate issues.  At one time, his dad's PSA was elevated but is gone back down to 1.  He is asymptomatic today.   IPSS     Row Name 2021-04-13 0800         International Prostate Symptom Score   How often have you had the sensation of not emptying your bladder? Not at All     How often have you had to urinate less than every two hours? Less than half the time     How often have you found you stopped and started again several times when you urinated? Not at All     How often have you found it difficult to postpone urination? Not at All     How often have  you had a weak urinary stream? Less than half the time     How often have you had to strain to start urination? Less than 1 in 5 times     How many times did you typically get up at night to urinate? 2 Times     Total IPSS Score 7       Quality of Life due to urinary symptoms   If you were to spend the rest of your life with your urinary condition just the way it is now how would you feel about that? Mostly Satisfied              Score:  1-7 Mild 8-19 Moderate 20-35 Severe   PMH: Past Medical History:  Diagnosis Date   Calculus of gallbladder without cholecystitis without obstruction 07/09/2017   Cardiomyopathy, familial (HCC) 02/15/2016   Chronic kidney disease (CKD), stage II (mild) 01/11/2020   Episode of moderate major depression (HCC) 11/16/2016   Family history of sudden cardiac death 2021-04-13   Hyperlipidemia, mixed 05/13/2018   Formatting of this note might be different from the original. A. 01/2018: TC 220, TG 124, HDL 43, LDL 152 Formatting of this note might be different from the original. Formatting of this note might be different from the original. A. 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 Per MRI 10/2017 - needs repeat MRI in 3 months   Pancreatic cyst 11/04/2017   Formatting of this note might be different from the original. CT 7/16: 3.3 x 2.7 cm pancreatic head fluid collection Formatting of this note might be different from the original. Formatting of this note might be different from the original. CT 7/16: 3.3 x 2.7 cm pancreatic head fluid collection   Prostatitis 02/09/2021   Pyelonephritis 02/09/2021   S/P orthotopic heart transplant (HCC) 01/11/2017   Formatting of this note might be different from the  original. Surgeon: Milano Date of Transplant: 01/03/17  Increased Risk: No VAD: No IABP: Yes  Donor CMV: Negative Recipient CMV: Negative  Donor Toxo IgG: Negative Donor Toxo IgM: Not done  Donor HCV NAT: Negative Donor Anti-HCV: Negative  Transmedics: No Cors: Left dominant, LM normal, LAD 20% mid, LCx normal, RCA normal Formatting of this note m   [facility] 02/09/2021   Formatting of this note might be different from the original. h/o CVA, no residuals Formatting of this note might be different from the original. Formatting of this note might be different from the original. 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 If you have any questions, ask your nurse or doctor.          STOP taking these medications    Caltrate 600 1500 (600 Ca) MG Tabs tablet Generic drug: calcium carbonate Stopped by: Vanna ScotlandAshley Chastin Riesgo, MD   Evolocumab 140 MG/ML Soaj Stopped by: Vanna ScotlandAshley Neng Albee, MD   metoprolol succinate 25 MG 24 hr tablet Commonly known as: TOPROL-XL Stopped by: Vanna ScotlandAshley Yi Haugan, MD   mycophenolate 500 MG tablet Commonly known as: CELLCEPT Stopped by: Vanna ScotlandAshley Taliya Mcclard, MD   pantoprazole 40 MG tablet Commonly known as: PROTONIX Stopped by: Vanna ScotlandAshley Zamantha Strebel, MD   warfarin 3 MG tablet Commonly known as: COUMADIN Stopped by: Vanna ScotlandAshley Ona Rathert, MD       TAKE these medications    allopurinol 100 MG tablet Commonly known as: ZYLOPRIM Take by mouth. What changed: Another medication with the same name was removed. Continue taking this medication, and follow the directions you see here. Changed by: Vanna ScotlandAshley Moise Friday, MD   amLODipine 10 MG tablet Commonly known as: NORVASC Take by mouth.   aspirin EC 81 MG tablet Take by  mouth.   hydrALAZINE 50 MG tablet Commonly known as: APRESOLINE Take 50 mg by mouth 2 (two) times daily.   mirtazapine 15 MG tablet Commonly known as: REMERON Take 15 mg by mouth at bedtime.   predniSONE 2.5 MG tablet Commonly known as: DELTASONE Take 1 tablet by mouth daily. What changed: Another medication with the same name was removed. Continue taking this medication, and follow the directions you see here. Changed by: Vanna ScotlandAshley Monserrath Junio, MD   sirolimus 1 MG tablet Commonly known as: RAPAMUNE Take 1 mg by mouth daily.   tacrolimus 1 MG capsule Commonly known as: PROGRAF Take by mouth.  tamsulosin 0.4 MG Caps capsule Commonly known as: FLOMAX Take 0.4 mg by mouth daily.   torsemide 20 MG tablet Commonly known as: DEMADEX Take by mouth.   traZODone 50 MG tablet Commonly known as: DESYREL Take 50 mg by mouth at bedtime.        Allergies:  Allergies  Allergen Reactions   Shellfish Allergy Swelling    Family History: History reviewed. No pertinent family history.  Social History:  reports that he has never smoked. He has never used smokeless tobacco. No history on file for alcohol use and drug use.   Physical Exam: BP 133/84    Pulse 78    Ht 6' (1.829 m)    Wt 237 lb (107.5 kg)    BMI 32.14 kg/m   Constitutional:  Alert and oriented, No acute distress. HEENT: [place] AT, moist mucus membranes.  Trachea midline, no masses. Cardiovascular: No clubbing, cyanosis, or edema. Respiratory: Normal respiratory effort, no increased work of breathing. Rectal: Normal sphincter tone,  20  CC prostate, smooth no nodules Skin: No rashes, bruises or suspicious lesions. Neurologic: Grossly intact, no focal deficits, moving all 4 extremities. Psychiatric: Normal mood and affect.   Urinalysis Repeat urinalysis from 10/31 reviewed.   Assessment & Plan:    Elevated PSA  - We reviewed the implications of an elevated PSA and the uncertainty surrounding it. In general, a man's  PSA increases with age and is produced by both normal and cancerous prostate tissue. Differential for elevated PSA is BPH, prostate cancer, infection, recent intercourse/ejaculation, prostate infarction, recent urethroscopic manipulation (foley placement/cystoscopy) and prostatitis. Management of an elevated PSA can include observation or prostate biopsy and wediscussed this in detail. We discussed that indications for prostate biopsy are defined by age and race specific PSA cutoffs as well as a PSA velocity of 0.75/year.  -Repeat PSA pending  2. History of prostatitis -Based on patient's proximity of elevated PSA to his acute prostatitis episode, this is likely the etiology -Would recommend repeating PSA,  anticipate return to baseline -Given his history of immunosuppression, would strongly recommend early intervention if and when he has any urinary symptoms   Conley Rolls as a scribe for Hollice Espy, MD.,have documented all relevant documentation on the behalf of Hollice Espy, MD,as directed by  Hollice Espy, MD while in the presence of Hollice Espy, MD.  I have reviewed the above documentation for accuracy and completeness, and I agree with the above.   Hollice Espy, MD    [facility] [address], [place], [place] [address] [phone]

## 2021-04-06 ENCOUNTER — Ambulatory Visit: Payer: BC Managed Care – PPO | Admitting: Urology

## 2021-04-06 ENCOUNTER — Encounter: Payer: Self-pay | Admitting: Urology

## 2021-04-06 ENCOUNTER — Other Ambulatory Visit
Admission: RE | Admit: 2021-04-06 | Discharge: 2021-04-06 | Disposition: A | Discharge status: Home / Self Care | Payer: BC Managed Care – PPO | Attending: Urology | Admitting: Urology

## 2021-04-06 ENCOUNTER — Other Ambulatory Visit: Payer: Self-pay

## 2021-04-06 VITALS — BP 133/84 | HR 78 | Ht 72.0 in | Wt 237.0 lb

## 2021-04-06 DIAGNOSIS — R972 Elevated prostate specific antigen [PSA]: Secondary | ICD-10-CM | POA: Diagnosis present

## 2021-04-06 DIAGNOSIS — Z8241 Family history of sudden cardiac death: Secondary | ICD-10-CM

## 2021-04-06 DIAGNOSIS — Z87438 Personal history of other diseases of male genital organs: Secondary | ICD-10-CM | POA: Diagnosis not present

## 2021-04-06 HISTORY — DX: Family history of sudden cardiac death: Z82.41

## 2021-04-06 LAB — PSA: Prostatic Specific Antigen: 4.4 ng/mL — ABNORMAL HIGH (ref 0.00–4.00)

## 2021-04-09 ENCOUNTER — Telehealth: Payer: Self-pay | Admitting: *Deleted

## 2021-04-09 NOTE — Telephone Encounter (Signed)
Notified patient as instructed, patient pleased. Discussed follow-up appointments, patient agrees  

## 2021-04-09 NOTE — Telephone Encounter (Signed)
-----   Message from Vanna Scotland, MD sent at 04/09/2021 12:44 PM EST ----- PSA still remains elevated, 4.4 but continues to go in the right direction.  Lets recheck it again in 3 months to ensure that it turns all the way back down to baseline.  Please set him up for a lab visit for this purpose.  Vanna Scotland, MD

## 2021-07-06 ENCOUNTER — Other Ambulatory Visit: Payer: Self-pay

## 2021-07-06 DIAGNOSIS — R972 Elevated prostate specific antigen [PSA]: Secondary | ICD-10-CM

## 2021-07-10 ENCOUNTER — Other Ambulatory Visit: Payer: BC Managed Care – PPO

## 2021-07-19 ENCOUNTER — Other Ambulatory Visit: Payer: BC Managed Care – PPO

## 2021-07-19 DIAGNOSIS — R972 Elevated prostate specific antigen [PSA]: Secondary | ICD-10-CM

## 2021-07-20 LAB — PSA: Prostate Specific Ag, Serum: 0.5 ng/mL (ref 0.0–4.0)

## 2021-07-24 ENCOUNTER — Telehealth: Payer: Self-pay | Admitting: *Deleted

## 2021-07-24 NOTE — Telephone Encounter (Addendum)
Patient informed, voiced understanding.  ? ? ? ?----- Message from Vanna Scotland, MD sent at 07/23/2021  8:50 AM EDT ----- ?PSA is 0.5, low and appropriate for your age. ? ?Vanna Scotland, MD ? ?

## 2022-01-23 ENCOUNTER — Ambulatory Visit
Admission: RE | Admit: 2022-01-23 | Discharge: 2022-01-23 | Disposition: A | Discharge status: Home / Self Care | Payer: BC Managed Care – PPO | Source: Ambulatory Visit | Attending: Internal Medicine | Admitting: Internal Medicine

## 2022-01-23 ENCOUNTER — Other Ambulatory Visit: Payer: Self-pay | Admitting: Internal Medicine

## 2022-01-23 DIAGNOSIS — M7989 Other specified soft tissue disorders: Secondary | ICD-10-CM | POA: Diagnosis present

## 2022-09-27 ENCOUNTER — Emergency Department
Admission: EM | Admit: 2022-09-27 | Discharge: 2022-09-27 | Disposition: A | Discharge status: Home / Self Care | Payer: Self-pay | Attending: Emergency Medicine | Admitting: Emergency Medicine

## 2022-09-27 ENCOUNTER — Emergency Department: Payer: Self-pay

## 2022-09-27 ENCOUNTER — Other Ambulatory Visit: Payer: Self-pay

## 2022-09-27 DIAGNOSIS — S5011XA Contusion of right forearm, initial encounter: Secondary | ICD-10-CM | POA: Insufficient documentation

## 2022-09-27 DIAGNOSIS — N189 Chronic kidney disease, unspecified: Secondary | ICD-10-CM | POA: Insufficient documentation

## 2022-09-27 DIAGNOSIS — W540XXA Bitten by dog, initial encounter: Secondary | ICD-10-CM | POA: Insufficient documentation

## 2022-09-27 DIAGNOSIS — S51831A Puncture wound without foreign body of right forearm, initial encounter: Secondary | ICD-10-CM | POA: Insufficient documentation

## 2022-09-27 DIAGNOSIS — T148XXA Other injury of unspecified body region, initial encounter: Secondary | ICD-10-CM

## 2022-09-27 MED ORDER — OXYCODONE-ACETAMINOPHEN 5-325 MG PO TABS
1.0000 | ORAL_TABLET | Freq: Once | ORAL | Status: AC
  Administered 2022-09-27: 1 via ORAL
  Filled 2022-09-27: qty 1

## 2022-09-27 MED ORDER — AMOXICILLIN-POT CLAVULANATE 875-125 MG PO TABS: 1.0000 | ORAL_TABLET | Freq: Two times a day (BID) | ORAL | 0 refills | Status: AC

## 2022-09-27 MED ORDER — AMOXICILLIN-POT CLAVULANATE 875-125 MG PO TABS
1.0000 | ORAL_TABLET | Freq: Once | ORAL | Status: AC
  Administered 2022-09-27: 1 via ORAL
  Filled 2022-09-27: qty 1

## 2022-09-27 MED ORDER — MORPHINE SULFATE (PF) 4 MG/ML IV SOLN
4.0000 mg | Freq: Once | INTRAVENOUS | Status: AC
  Administered 2022-09-27: 4 mg via INTRAVENOUS
  Filled 2022-09-27: qty 1

## 2022-09-27 MED ORDER — ONDANSETRON HCL 4 MG/2ML IJ SOLN
4.0000 mg | Freq: Once | INTRAMUSCULAR | Status: AC
  Administered 2022-09-27: 4 mg via INTRAVENOUS
  Filled 2022-09-27: qty 2

## 2022-09-27 MED ORDER — OXYCODONE-ACETAMINOPHEN 7.5-325 MG PO TABS: 1.0000 | ORAL_TABLET | ORAL | 0 refills | Status: AC | PRN

## 2022-09-27 MED ORDER — FENTANYL CITRATE PF 50 MCG/ML IJ SOSY
50.0000 ug | PREFILLED_SYRINGE | Freq: Once | INTRAMUSCULAR | Status: AC
  Administered 2022-09-27: 50 ug via INTRAVENOUS
  Filled 2022-09-27: qty 1

## 2022-09-27 NOTE — ED Triage Notes (Signed)
Pt to ed from home via [facility] for dog bites.   Injuries all over.  Pt has obvious deformity to left forearm. Pt has pulses and can move fingers in wrist.  Pt has several puncture wounds to left arm as well.   Pt had to break up his dogs that was fighting.

## 2022-09-27 NOTE — ED Provider Notes (Signed)
[facility] Provider Note    Event Date/Time   First MD Initiated Contact with Patient 09/27/22 2026     (approximate)   History   Animal Bite   HPI  Nicholas Weaver is a 47 y.o. male orthotopic heart transplant patient, CKD, CVA, and as listed in EMR presents to the emergency department for treatment and evaluation of left lower arm.  While trying to break up his dogs that were fighting he was bitten and now has several puncture wounds to the forearm. Dogs are up to date on vaccinations. Patient's tdap booster is current.      Physical Exam   Triage Vital Signs: ED Triage Vitals [09/27/22 1841]  Enc Vitals Group     BP 119/71     Pulse Rate 90     Resp 16     Temp 98.3 F (36.8 C)     Temp Source Oral     SpO2 98 %     Weight      Height 6' (1.829 m)     Head Circumference      Peak Flow      Pain Score 10     Pain Loc      Pain Edu?      Excl. in GC?     Most recent vital signs: Vitals:   09/27/22 1841 09/27/22 2143  BP: 119/71 127/77  Pulse: 90 84  Resp: 16 16  Temp: 98.3 F (36.8 C)   SpO2: 98% 96%    General: Awake, no distress.  CV:  Good peripheral perfusion.  Resp:  Normal effort.  Abd:  No distention.  Other:  multiple puncture wounds to the dorsal aspect of the left forearm with hematoma    ED Results / Procedures / Treatments   Labs (all labs ordered are listed, but only abnormal results are displayed) Labs Reviewed - No data to display   EKG  Not indicatead.   RADIOLOGY  Image and radiology report reviewed and interpreted by me. Radiology report consistent with the same.  Image of the left forearm is negative for retained foreign body or acute bony abnormality.  PROCEDURES:  Critical Care performed: No  Procedures   MEDICATIONS ORDERED IN ED:  Medications  fentaNYL (SUBLIMAZE) injection 50 mcg (50 mcg Intravenous Given 09/27/22 1851)  morphine (PF) 4 MG/ML injection 4 mg (4 mg Intravenous  Given 09/27/22 2143)  ondansetron (ZOFRAN) injection 4 mg (4 mg Intravenous Given 09/27/22 2142)  amoxicillin-clavulanate (AUGMENTIN) 875-125 MG per tablet 1 tablet (1 tablet Oral Given 09/27/22 2206)  oxyCODONE-acetaminophen (PERCOCET/ROXICET) 5-325 MG per tablet 1 tablet (1 tablet Oral Given 09/27/22 2209)     IMPRESSION / MDM / ASSESSMENT AND PLAN / ED COURSE   I have reviewed the triage note.  Differential diagnosis includes, but is not limited to, radius/ulna fracture, retained foreign body, puncture wounds   Patient's presentation is most consistent with acute complicated illness / injury requiring diagnostic workup.  47 year old male presents to the emergency department after trying to break up his dogs while they were fighting.  He now has puncture wounds to the left lower arm.  Patient has significant pain when trying to move the ring and pinky finger.   X-ray is negative for retained foreign body or bony abnormality.  Wounds cleaned with chlorhexidine and saline.  Nonstick dressing applied.  Ace bandage applied to obtain light compression. Pain medication and antibiotic started tonight. He is to follow up with  primary care early next week for wound check. If symptoms change or worsen over the weekend, he is to return to the ER.      FINAL CLINICAL IMPRESSION(S) / ED DIAGNOSES   Final diagnoses:  Dog bite of right forearm, initial encounter  Traumatic hematoma     Rx / DC Orders   ED Discharge Orders          Ordered    amoxicillin-clavulanate (AUGMENTIN) 875-125 MG tablet  2 times daily        09/27/22 2201    oxyCODONE-acetaminophen (PERCOCET) 7.5-325 MG tablet  Every 4 hours PRN        09/27/22 2201             Note:  This document was prepared using Dragon voice recognition software and may include unintentional dictation errors.   Chinita Pester, FNP 09/27/22 1610    Minna Antis, MD 09/30/22 1441

## 2022-09-27 NOTE — Discharge Instructions (Signed)
Please call and schedule a follow up with Dr. Judithann Sheen for wound check early next week.  Apply ice pack over the arm off-and-on 20 minutes/h while awake.  Take the antibiotic as prescribed.  If you are taking the pain medication, do not take any additional Tylenol.  Over the weekend, if your pain is not well-controlled or if you have other symptoms of concern please return to the emergency department.

## 2022-09-27 NOTE — ED Triage Notes (Signed)
First Nurse: Pt here via [facility] with a left arm deformity. Pt was breaking up his dogs from fighting, has several puncture wounds and an obvious deformity. Pt's dogs are up to date. Pt had a heart transplant in 2018.   89 97% RA 154/68

## 2022-10-10 ENCOUNTER — Ambulatory Visit
Admission: RE | Admit: 2022-10-10 | Discharge: 2022-10-10 | Disposition: A | Discharge status: Home / Self Care | Payer: BC Managed Care – PPO | Source: Ambulatory Visit | Attending: Internal Medicine | Admitting: Internal Medicine

## 2022-10-10 ENCOUNTER — Other Ambulatory Visit: Payer: Self-pay | Admitting: Internal Medicine

## 2022-10-10 ENCOUNTER — Encounter: Payer: Self-pay | Admitting: Internal Medicine

## 2022-10-10 DIAGNOSIS — L02414 Cutaneous abscess of left upper limb: Secondary | ICD-10-CM

## 2022-10-10 DIAGNOSIS — T148XXA Other injury of unspecified body region, initial encounter: Secondary | ICD-10-CM | POA: Insufficient documentation

## 2022-10-10 DIAGNOSIS — M79602 Pain in left arm: Secondary | ICD-10-CM | POA: Insufficient documentation

## 2022-10-10 MED ORDER — GADOBUTROL 1 MMOL/ML IV SOLN
10.0000 mL | Freq: Once | INTRAVENOUS | Status: AC | PRN
  Administered 2022-10-10: 10 mL via INTRAVENOUS

## 2023-11-13 ENCOUNTER — Encounter: Payer: Self-pay | Admitting: Internal Medicine

## 2023-11-25 ENCOUNTER — Ambulatory Visit
Admission: RE | Admit: 2023-11-25 | Discharge: 2023-11-25 | Disposition: A | Discharge status: Home / Self Care | Attending: Internal Medicine | Admitting: Internal Medicine

## 2023-11-25 ENCOUNTER — Other Ambulatory Visit: Payer: Self-pay

## 2023-11-25 ENCOUNTER — Encounter: Payer: Self-pay | Admitting: Internal Medicine

## 2023-11-25 ENCOUNTER — Ambulatory Visit: Admitting: Certified Registered Nurse Anesthetist

## 2023-11-25 ENCOUNTER — Encounter
Admission: RE | Disposition: A | Discharge status: Home / Self Care | Payer: Self-pay | Source: Home / Self Care | Attending: Internal Medicine

## 2023-11-25 DIAGNOSIS — Z79621 Long term (current) use of calcineurin inhibitor: Secondary | ICD-10-CM | POA: Insufficient documentation

## 2023-11-25 DIAGNOSIS — K591 Functional diarrhea: Secondary | ICD-10-CM | POA: Diagnosis not present

## 2023-11-25 DIAGNOSIS — R1310 Dysphagia, unspecified: Secondary | ICD-10-CM | POA: Insufficient documentation

## 2023-11-25 DIAGNOSIS — F32A Depression, unspecified: Secondary | ICD-10-CM | POA: Insufficient documentation

## 2023-11-25 DIAGNOSIS — Z8673 Personal history of transient ischemic attack (TIA), and cerebral infarction without residual deficits: Secondary | ICD-10-CM | POA: Diagnosis not present

## 2023-11-25 DIAGNOSIS — Z941 Heart transplant status: Secondary | ICD-10-CM | POA: Diagnosis not present

## 2023-11-25 HISTORY — DX: Personal history of urinary calculi: Z87.442

## 2023-11-25 HISTORY — DX: Drug-induced tremor: G25.1

## 2023-11-25 SURGERY — COLONOSCOPY
Anesthesia: General

## 2023-11-25 MED ORDER — GLYCOPYRROLATE 0.2 MG/ML IJ SOLN
INTRAMUSCULAR | Status: AC
  Filled 2023-11-25: qty 1

## 2023-11-25 MED ORDER — SODIUM CHLORIDE 0.9 % IV SOLN: INTRAVENOUS | Status: DC

## 2023-11-25 MED ORDER — LIDOCAINE HCL (PF) 2 % IJ SOLN
INTRAMUSCULAR | Status: AC
  Filled 2023-11-25: qty 5

## 2023-11-25 MED ORDER — STERILE WATER FOR IRRIGATION IR SOLN
Status: DC | PRN
  Administered 2023-11-25: 60 mL

## 2023-11-25 MED ORDER — LIDOCAINE HCL (CARDIAC) PF 100 MG/5ML IV SOSY
PREFILLED_SYRINGE | INTRAVENOUS | Status: DC | PRN
  Administered 2023-11-25: 50 mg via INTRAVENOUS

## 2023-11-25 MED ORDER — GLYCOPYRROLATE 0.2 MG/ML IJ SOLN
INTRAMUSCULAR | Status: DC | PRN
  Administered 2023-11-25: .2 mg via INTRAVENOUS

## 2023-11-25 MED ORDER — PROPOFOL 10 MG/ML IV BOLUS
INTRAVENOUS | Status: DC | PRN
  Administered 2023-11-25: 20 mg via INTRAVENOUS
  Administered 2023-11-25: 125 ug/kg/min via INTRAVENOUS
  Administered 2023-11-25: 80 mg via INTRAVENOUS

## 2023-11-25 MED ORDER — DEXMEDETOMIDINE HCL IN NACL 80 MCG/20ML IV SOLN
INTRAVENOUS | Status: DC | PRN
  Administered 2023-11-25: 8 ug via INTRAVENOUS

## 2023-11-25 MED ORDER — DEXMEDETOMIDINE HCL IN NACL 80 MCG/20ML IV SOLN
INTRAVENOUS | Status: AC
  Filled 2023-11-25: qty 20

## 2023-11-25 MED ORDER — PROPOFOL 1000 MG/100ML IV EMUL
INTRAVENOUS | Status: AC
  Filled 2023-11-25: qty 100

## 2023-11-25 NOTE — Anesthesia Procedure Notes (Signed)
 Date/Time: 11/25/2023 9:47 AM  Performed by: Dominica Krabbe, CRNAPre-anesthesia Checklist: Emergency Drugs available, Patient identified, Suction available, Patient being monitored and Timeout performed Patient Re-evaluated:Patient Re-evaluated prior to induction Oxygen Delivery Method: Nasal cannula Preoxygenation: Pre-oxygenation with 100% oxygen Induction Type: IV induction

## 2023-11-25 NOTE — Interval H&P Note (Signed)
 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  The various methods of treatment have been discussed with the patient and family. After consideration of risks, benefits and other options for treatment, the patient has consented to  Procedure(s): COLONOSCOPY (N/A) EGD (ESOPHAGOGASTRODUODENOSCOPY) (N/A) as a surgical intervention.  The patient's history has been reviewed, patient examined, no change in status, stable for surgery.  I have reviewed the patient's chart and labs.  Questions were answered to the patient's satisfaction.     [place], Omari Koslosky

## 2023-11-25 NOTE — H&P (Signed)
 Outpatient short stay form Pre-procedure 11/25/2023 9:35 AM Nicholas Weaver K. Aundria, M.D.  Primary Physician: Reyes Costa, M.D.  Reason for visit:  Functional diarrhea  Esophageal dysphagia   History of present illness:  Mr. Clayson is a 48 year old male with a history of heart transplant on tacrolimus . Patient presents with histor yof abdominal pain which he attributed previously to a bad diet on his last office visit with his internal medicine provider, Selinda Quan, in June 2025.  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  Patient says he has had intermittent loose stools for many years dating back to his early 65s, but has noticed over the last 3 to 4 months he is having frequent morning time diarrhea along with postprandial urgency and diarrhea. He is having up to 5 or 6 bowel movements per day, most of the time less than 4. He has had a few instances of nocturnal diarrhea but this has been rare. He denies any rectal bleeding. He denies any significant abdominal pain although he does have mild cramping with recurrent symptoms of bloating, urgency, mucus in the stools and a sensation of incomplete defecation with altered stool caliber. This appears to suffice criteria for irritable bowel syndrome. He does remark taking antibiotics few months ago for an infection. He denies any weight loss. He reports rare instances of heartburn but most of the time over the last 2 weeks has had dysphagia intermittently to solids up to the level of the mid sternum. This has not been present previously. He denies smoking or frequent NSAID use. He does take antirejection drugs for his history of heart transplantation His mother is deceased although he denies any known family history of colorectal cancer, polyps or inflammatory bowel disease. Father still living although he has no  known significant GI history, according to the patient.     Current Facility-Administered Medications:    0.9 %  sodium chloride  infusion, , Intravenous, Continuous, [place], Valon Glasscock K, MD, Last Rate: 20 mL/hr at 11/25/23 9065, Continued from Pre-op at 11/25/23 0934  Medications Prior to Admission  Medication Sig Dispense Refill Last Dose/Taking   amLODipine (NORVASC) 10 MG tablet Take by mouth.   Past Week   aspirin EC 81 MG tablet Take by mouth.   Past Week   hydrALAZINE (APRESOLINE) 50 MG tablet Take 50 mg by mouth 2 (two) times daily.   Past Week   mirtazapine  (REMERON ) 15 MG tablet Take 15 mg by mouth at bedtime.   Past Week   sirolimus (RAPAMUNE) 1 MG tablet Take 1 mg by mouth daily.   Past Week   tacrolimus  (PROGRAF ) 1 MG capsule Take by mouth.   Past Week   tamsulosin (FLOMAX) 0.4 MG CAPS capsule Take 0.4 mg by mouth daily.   Past Week   torsemide (DEMADEX) 20 MG tablet Take by mouth.   Past Week   traZODone (DESYREL) 50 MG tablet Take 50 mg by mouth at bedtime.   Past Week     Allergies  Allergen Reactions   Shellfish Allergy Swelling     Past Medical History:  Diagnosis Date   Calculus of gallbladder without cholecystitis without obstruction 07/09/2017   Cardiomyopathy, familial (HCC) 02/15/2016   Chronic kidney disease (CKD), stage II (mild) 01/11/2020   Episode of moderate major depression (HCC) 11/16/2016   Family history of sudden cardiac death 05/05/21   History  of kidney stones    Hyperlipidemia, mixed 05/13/2018   Formatting of this note might be different from the original. A. 01/2018: TC 220, TG 124, HDL 43, LDL 152 Formatting of this note might be different from the original. Formatting of this note might be different from the original. A. 01/2018: TC 220, TG 124, HDL 43, LDL 152   Immunosuppression (HCC) 04/03/2017   Insomnia due to medical condition 11/16/2016   Iron deficiency anemia secondary to inadequate dietary iron intake 01/22/2017   Iron  deficiency anemia secondary to inadequate dietary iron intake    Liver fibrosis 05/15/2018   Formatting of this note might be different from the original. Per MRI 10/2017 - needs repeat MRI in 3 months   Pancreatic cyst 11/04/2017   Formatting of this note might be different from the original. CT 7/16: 3.3 x 2.7 cm pancreatic head fluid collection Formatting of this note might be different from the original. Formatting of this note might be different from the original. CT 7/16: 3.3 x 2.7 cm pancreatic head fluid collection   Prostatitis 02/09/2021   Pyelonephritis 02/09/2021   S/P orthotopic heart transplant (HCC) 01/11/2017   Formatting of this note might be different from the original. Surgeon: Milano Date of Transplant: 01/03/17  Increased Risk: No VAD: No IABP: Yes  Donor CMV: Negative Recipient CMV: Negative  Donor Toxo IgG: Negative Donor Toxo IgM: Not done  Donor HCV NAT: Negative Donor Anti-HCV: Negative  Transmedics: No Cors: Left dominant, LM normal, LAD 20% mid, LCx normal, RCA normal Formatting of this note m   Stroke ([facility]) 02/09/2021   Formatting of this note might be different from the original. h/o CVA, no residuals Formatting of this note might be different from the original. Formatting of this note might be different from the original. 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    Physical Exam  Gen: Alert, oriented. Appears stated age.  HEENT: [place]/AT. PERRLA. Lungs: CTA, no wheezes. CV: RR nl S1, S2. Abd: soft, benign, no masses. BS+ Ext: No edema. Pulses 2+    Planned procedures: Proceed with EGD and colonoscopy. The patient understands the nature of the planned procedure, indications, risks, alternatives and potential complications including but not limited to bleeding, infection, perforation, damage to internal organs and possible oversedation/side effects from  anesthesia. The patient agrees and gives consent to proceed.  Please refer to procedure notes for findings, recommendations and patient disposition/instructions.     Reha Martinovich K. Aundria, M.D. Gastroenterology 11/25/2023  9:35 AM

## 2023-11-25 NOTE — Op Note (Signed)
 [facility] Gastroenterology Patient Name: Nicholas Weaver Procedure Date: 11/25/2023 9:31 AM MRN: [id number] Account #: [id number] Date of Birth: 09-Oct-1975 Admit Type: Outpatient Age: 48 Room: [facility] ENDO ROOM 3 Gender: Male Note Status: Finalized Instrument Name: Donnita [id number] Procedure:             Upper GI endoscopy Indications:           Dysphagia, Suspected esophageal reflux Providers:             Wissam Resor K. Aundria MD, MD Referring MD:          Reyes BIRCH. Auston, MD (Referring MD) Medicines:             Propofol  per Anesthesia Complications:         No immediate complications. Estimated blood loss: None. Procedure:             Pre-Anesthesia Assessment:                        - The risks and benefits of the procedure and the                         sedation options and risks were discussed with the                         patient. All questions were answered and informed                         consent was obtained.                        - Patient identification and proposed procedure were                         verified prior to the procedure by the nurse. The                         procedure was verified in the procedure room.                        - ASA Grade Assessment: III - A patient with severe                         systemic disease.                        - After reviewing the risks and benefits, the patient                         was deemed in satisfactory condition to undergo the                         procedure.                        After obtaining informed consent, the endoscope was                         passed under direct vision. Throughout the procedure,  the patient's blood pressure, pulse, and oxygen                         saturations were monitored continuously. The Endoscope                         was introduced through the mouth, and advanced to the                         third part of duodenum.  The upper GI endoscopy was                         accomplished without difficulty. The patient tolerated                         the procedure well. The upper GI endoscopy was                         accomplished without difficulty. The patient tolerated                         the procedure well. Findings:      The examined duodenum was normal.      The stomach was normal.      The examined esophagus was normal. The scope was withdrawn. Dilation was       performed with a Maloney dilator with no resistance at 58 Fr. Estimated       blood loss: none. Impression:            - Normal examined duodenum.                        - Normal stomach.                        - Normal esophagus. Dilated.                        - No specimens collected. Recommendation:        - Monitor results to esophageal dilation                        - Proceed with colonoscopy Procedure Code(s):     --- Professional ---                        [id number], Esophagogastroduodenoscopy, flexible,                         transoral; diagnostic, including collection of                         specimen(s) by brushing or washing, when performed                         (separate procedure)                        43450, Dilation of esophagus, by unguided sound or                         bougie,  single or multiple passes Diagnosis Code(s):     --- Professional ---                        R13.10, Dysphagia, unspecified CPT copyright 2022 American Medical Association. All rights reserved. The codes documented in this report are preliminary and upon coder review may  be revised to meet current compliance requirements. Ladell MARLA Boss MD, MD 11/25/2023 9:55:55 AM This report has been signed electronically. Number of Addenda: 0 Note Initiated On: 11/25/2023 9:31 AM Estimated Blood Loss:  Estimated blood loss: none.      [facility]

## 2023-11-25 NOTE — Transfer of Care (Signed)
 Immediate Anesthesia Transfer of Care Note  Patient: Nicholas Weaver  Procedure(s) Performed: COLONOSCOPY EGD (ESOPHAGOGASTRODUODENOSCOPY) DILATION, ESOPHAGUS  Patient Location: Endoscopy Unit  Anesthesia Type:General  Level of Consciousness: awake and drowsy  Airway & Oxygen Therapy: Patient Spontanous Breathing  Post-op Assessment: Report given to RN and Post -op Vital signs reviewed and stable  Post vital signs: Reviewed and stable  Last Vitals:  Vitals Value Taken Time  BP 110/76 11/25/23 10:17  Temp    Pulse 77 11/25/23 10:18  Resp 24 11/25/23 10:18  SpO2 97 % 11/25/23 10:18  Vitals shown include unfiled device data.  Last Pain:  Vitals:   11/25/23 1017  TempSrc:   PainSc: Asleep         Complications: No notable events documented.

## 2023-11-25 NOTE — Anesthesia Preprocedure Evaluation (Signed)
 Anesthesia Evaluation  Patient identified by MRN, date of birth, ID band Patient awake    Reviewed: Allergy & Precautions, H&P , NPO status , Patient's Chart, lab work & pertinent test results, reviewed documented beta blocker date and time   Airway Mallampati: II   Neck ROM: full    Dental  (+) Poor Dentition   Pulmonary neg pulmonary ROS   Pulmonary exam normal        Cardiovascular negative cardio ROS Normal cardiovascular exam Rhythm:regular Rate:Normal     Neuro/Psych    Depression    CVA  negative psych ROS   GI/Hepatic negative GI ROS, Neg liver ROS,,,  Endo/Other  negative endocrine ROS    Renal/GU Renal disease  negative genitourinary   Musculoskeletal   Abdominal   Peds  Hematology  (+) Blood dyscrasia, anemia   Anesthesia Other Findings Past Medical History: 07/09/2017: Calculus of gallbladder without cholecystitis without  obstruction 02/15/2016: Cardiomyopathy, familial (HCC) 01/11/2020: Chronic kidney disease (CKD), stage II (mild) 11/16/2016: Episode of moderate major depression (HCC) 04/06/2021: Family history of sudden cardiac death No date: History of kidney stones 05/13/2018: Hyperlipidemia, mixed     Comment:  Formatting of this note might be different from the               original. A. 01/2018: TC 220, TG 124, HDL 43, LDL 152               Formatting of this note might be different from the               original. Formatting of this note might be different from              the original. A. 01/2018: TC 220, TG 124, HDL 43, LDL 152 04/03/2017: Immunosuppression (HCC) 11/16/2016: Insomnia due to medical condition 01/22/2017: Iron deficiency anemia secondary to inadequate dietary  iron intake No date: Iron deficiency anemia secondary to inadequate dietary iron  intake 05/15/2018: Liver fibrosis     Comment:  Formatting of this note might be different from the               original. Per  MRI 10/2017 - needs repeat MRI in 3 months 11/04/2017: Pancreatic cyst     Comment:  Formatting of this note might be different from the               original. CT 7/16: 3.3 x 2.7 cm pancreatic head fluid               collection Formatting of this note might be different               from the original. Formatting of this note might be               different from the original. CT 7/16: 3.3 x 2.7 cm               pancreatic head fluid collection 02/09/2021: Prostatitis 02/09/2021: Pyelonephritis 01/11/2017: S/P orthotopic heart transplant [facility])     Comment:  Formatting of this note might be different from the               original. Surgeon: Milano Date of Transplant: 01/03/17                Increased Risk: No VAD: No IABP: Yes  Donor CMV: Negative              Recipient CMV: Negative  Donor Toxo IgG: Negative Donor               Toxo IgM: Not done  Donor HCV NAT: Negative Donor               Anti-HCV: Negative  Transmedics: No Cors: Left dominant,               LM normal, LAD 20% mid, LCx normal, RCA normal Formatting              of this note m 02/09/2021: Stroke [facility])     Comment:  Formatting of this note might be different from the               original. h/o CVA, no residuals Formatting of this note               might be different from the original. Formatting of this               note might be different from the original. 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[facility]) Past Surgical History: No date: heart transplant     Comment:  2018 No date: i & d of chest seroma No date: PACEMAKER LEAD REMOVAL No date: right femoral intra- aortic balloon removal BMI    Body Mass Index: 30.11 kg/m     Reproductive/Obstetrics negative OB ROS                              Anesthesia Physical Anesthesia Plan  ASA: 3  Anesthesia Plan: General   Post-op Pain Management:     Induction:   PONV Risk Score and Plan:   Airway Management Planned:   Additional Equipment:   Intra-op Plan:   Post-operative Plan:   Informed Consent: I have reviewed the patients History and Physical, chart, labs and discussed the procedure including the risks, benefits and alternatives for the proposed anesthesia with the patient or authorized representative who has indicated his/her understanding and acceptance.     Dental Advisory Given  Plan Discussed with: CRNA  Anesthesia Plan Comments:         Anesthesia Quick Evaluation

## 2023-11-25 NOTE — Op Note (Signed)
 [facility] Gastroenterology Patient Name: Tavien Chestnut Procedure Date: 11/25/2023 9:39 AM MRN: [id number] Account #: [id number] Date of Birth: November 12, 1975 Admit Type: Outpatient Age: 48 Room: [facility] ENDO ROOM 3 Gender: Male Note Status: Finalized Instrument Name: Colonoscope [id number] Procedure:             Colonoscopy Indications:           Functional diarrhea Providers:             Janaiyah Blackard K. Maxwel Meadowcroft MD, MD Medicines:             Propofol  per Anesthesia Complications:         No immediate complications. Estimated blood loss:                         Minimal. Procedure:             Pre-Anesthesia Assessment:                        - The risks and benefits of the procedure and the                         sedation options and risks were discussed with the                         patient. All questions were answered and informed                         consent was obtained.                        - Patient identification and proposed procedure were                         verified prior to the procedure by the nurse. The                         procedure was verified in the procedure room.                        - ASA Grade Assessment: III - A patient with severe                         systemic disease.                        - After reviewing the risks and benefits, the patient                         was deemed in satisfactory condition to undergo the                         procedure.                        After obtaining informed consent, the colonoscope was                         passed under direct vision. Throughout the procedure,  the patient's blood pressure, pulse, and oxygen                         saturations were monitored continuously. The                         Colonoscope was introduced through the anus and                         advanced to the the cecum, identified by appendiceal                         orifice and ileocecal  valve. The colonoscopy was                         performed without difficulty. The patient tolerated                         the procedure well. Findings:      The perianal and digital rectal examinations were normal. Pertinent       negatives include normal sphincter tone and no palpable rectal lesions.      The colon (entire examined portion) appeared normal. Biopsies for       histology were taken with a cold forceps from the random colon for       evaluation of microscopic colitis. Estimated blood loss was minimal.      The terminal ileum appeared normal.      The retroflexed view of the distal rectum and anal verge was normal and       showed no anal or rectal abnormalities. Impression:            - The entire examined colon is normal. Biopsied.                        - The examined portion of the ileum was normal.                        - The distal rectum and anal verge are normal on                         retroflexion view. Recommendation:        - Monitor results to esophageal dilation                        - Patient has a contact number available for                         emergencies. The signs and symptoms of potential                         delayed complications were discussed with the patient.                         Return to normal activities tomorrow. Written                         discharge instructions were provided to the patient.                        -  Resume previous diet.                        - Continue present medications.                        - Repeat colonoscopy in 10 years for screening                         purposes.                        - Return to my office in 3 months.                        - Telephone GI office to schedule appointment. 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 All rights reserved. The codes documented in this report are preliminary and upon coder review may  be revised to meet current compliance requirements. Ladell MARLA Boss MD, MD 11/25/2023 10:19:49 AM This report has been signed electronically. Number of Addenda: 0 Note Initiated On: 11/25/2023 9:39 AM Scope Withdrawal Time: 0 hours 7 minutes 18 seconds  Total Procedure Duration: 0 hours 14 minutes 44 seconds  Estimated Blood Loss:  Estimated blood loss was minimal.      [facility]

## 2023-11-25 NOTE — Anesthesia Postprocedure Evaluation (Signed)
 Anesthesia Post Note  Patient: Quinterious Walraven Feagan  Procedure(s) Performed: COLONOSCOPY EGD (ESOPHAGOGASTRODUODENOSCOPY) DILATION, ESOPHAGUS  Patient location during evaluation: PACU Anesthesia Type: General Level of consciousness: awake and alert Pain management: pain level controlled Vital Signs Assessment: post-procedure vital signs reviewed and stable Respiratory status: spontaneous breathing, nonlabored ventilation, respiratory function stable and patient connected to nasal cannula oxygen Cardiovascular status: blood pressure returned to baseline and stable Postop Assessment: no apparent nausea or vomiting Anesthetic complications: no   No notable events documented.   Last Vitals:  Vitals:   11/25/23 0848 11/25/23 1017  BP: (!) 147/95 110/76  Pulse: 84 73  Resp: 13 20  Temp: (!) 36.2 C   SpO2: 98% 95%    Last Pain:  Vitals:   11/25/23 1017  TempSrc:   PainSc: Asleep                 [place] KANDICE Clause

## 2023-11-26 LAB — SURGICAL PATHOLOGY

## 2024-03-24 ENCOUNTER — Other Ambulatory Visit: Payer: Self-pay | Admitting: Internal Medicine

## 2024-03-24 DIAGNOSIS — R1031 Right lower quadrant pain: Secondary | ICD-10-CM

## 2024-03-31 ENCOUNTER — Inpatient Hospital Stay: Admission: RE | Admit: 2024-03-31 | Discharge: 2024-03-31 | Attending: Internal Medicine | Admitting: Internal Medicine

## 2024-03-31 DIAGNOSIS — R1031 Right lower quadrant pain: Secondary | ICD-10-CM

## 2024-03-31 MED ORDER — IOPAMIDOL (ISOVUE-300) INJECTION 61%
100.0000 mL | Freq: Once | INTRAVENOUS | Status: AC | PRN
  Administered 2024-03-31: 15:00:00 100 mL via INTRAVENOUS
